# Patient Record
Sex: Male | Born: 1980 | Race: Black or African American | Hispanic: No | Marital: Single | Smoking: Current every day smoker
Health system: Southern US, Community
[De-identification: ages and names within clinical notes are randomized; demographics above are authoritative.]

## PROBLEM LIST (undated history)

## (undated) DIAGNOSIS — J45909 Unspecified asthma, uncomplicated: Secondary | ICD-10-CM

## (undated) HISTORY — PX: NO PAST SURGERIES: SHX2092

---

## 2008-07-26 ENCOUNTER — Emergency Department: Payer: Self-pay | Admitting: Emergency Medicine

## 2010-01-07 ENCOUNTER — Emergency Department (HOSPITAL_COMMUNITY): Admission: EM | Admit: 2010-01-07 | Discharge: 2010-01-07 | Payer: Self-pay | Admitting: Emergency Medicine

## 2010-08-23 ENCOUNTER — Emergency Department (HOSPITAL_COMMUNITY)
Admission: EM | Admit: 2010-08-23 | Discharge: 2010-08-23 | Payer: Self-pay | Source: Home / Self Care | Admitting: Emergency Medicine

## 2010-08-23 LAB — CARBOXYHEMOGLOBIN
Carboxyhemoglobin: 4.5 % — ABNORMAL HIGH (ref 0.5–1.5)
Methemoglobin: 1.6 % — ABNORMAL HIGH (ref 0.0–1.5)
O2 Saturation: 44.3 %
Total hemoglobin: 13.5 g/dL (ref 13.5–18.0)

## 2010-08-23 LAB — DIFFERENTIAL
Basophils Absolute: 0 10*3/uL (ref 0.0–0.1)
Basophils Relative: 0 % (ref 0–1)
Eosinophils Absolute: 0.1 10*3/uL (ref 0.0–0.7)
Eosinophils Relative: 1 % (ref 0–5)
Lymphocytes Relative: 12 % (ref 12–46)
Lymphs Abs: 1.3 10*3/uL (ref 0.7–4.0)
Monocytes Absolute: 0.8 10*3/uL (ref 0.1–1.0)
Monocytes Relative: 7 % (ref 3–12)
Neutro Abs: 8.7 10*3/uL — ABNORMAL HIGH (ref 1.7–7.7)
Neutrophils Relative %: 80 % — ABNORMAL HIGH (ref 43–77)

## 2010-08-23 LAB — BASIC METABOLIC PANEL
BUN: 13 mg/dL (ref 6–23)
CO2: 27 mEq/L (ref 19–32)
Calcium: 9.2 mg/dL (ref 8.4–10.5)
Chloride: 106 mEq/L (ref 96–112)
Creatinine, Ser: 1.26 mg/dL (ref 0.4–1.5)
GFR calc Af Amer: >60 mL/min (ref >60)
GFR calc non Af Amer: >60 mL/min (ref >60)
Glucose, Bld: 100 mg/dL — ABNORMAL HIGH (ref 70–99)
Potassium: 4 mEq/L (ref 3.5–5.1)
Sodium: 140 mEq/L (ref 135–145)

## 2010-08-23 LAB — CBC
HCT: 37.6 % — ABNORMAL LOW (ref 39.0–52.0)
Hemoglobin: 13.2 g/dL (ref 13.0–17.0)
MCH: 31.4 pg (ref 26.0–34.0)
MCHC: 35.1 g/dL (ref 30.0–36.0)
MCV: 89.5 fL (ref 78.0–100.0)
Platelets: 206 10*3/uL (ref 150–400)
RBC: 4.2 MIL/uL — ABNORMAL LOW (ref 4.22–5.81)
RDW: 12.8 % (ref 11.5–15.5)
WBC: 10.9 10*3/uL — ABNORMAL HIGH (ref 4.0–10.5)

## 2017-11-06 ENCOUNTER — Encounter (HOSPITAL_COMMUNITY): Payer: Self-pay | Admitting: Emergency Medicine

## 2017-11-06 ENCOUNTER — Emergency Department (HOSPITAL_COMMUNITY)
Admission: EM | Admit: 2017-11-06 | Discharge: 2017-11-07 | Disposition: A | Discharge status: Home / Self Care | Payer: Self-pay | Attending: Emergency Medicine | Admitting: Emergency Medicine

## 2017-11-06 DIAGNOSIS — R05 Cough: Secondary | ICD-10-CM | POA: Insufficient documentation

## 2017-11-06 DIAGNOSIS — J45909 Unspecified asthma, uncomplicated: Secondary | ICD-10-CM | POA: Insufficient documentation

## 2017-11-06 DIAGNOSIS — R0602 Shortness of breath: Secondary | ICD-10-CM | POA: Insufficient documentation

## 2017-11-06 DIAGNOSIS — J189 Pneumonia, unspecified organism: Secondary | ICD-10-CM | POA: Insufficient documentation

## 2017-11-06 DIAGNOSIS — R509 Fever, unspecified: Secondary | ICD-10-CM | POA: Insufficient documentation

## 2017-11-06 HISTORY — DX: Unspecified asthma, uncomplicated: J45.909

## 2017-11-06 MED ORDER — ALBUTEROL SULFATE HFA 108 (90 BASE) MCG/ACT IN AERS
2.0000 | INHALATION_SPRAY | RESPIRATORY_TRACT | Status: DC | PRN
  Administered 2017-11-06: 2 via RESPIRATORY_TRACT
  Filled 2017-11-06: qty 6.7

## 2017-11-06 NOTE — ED Provider Notes (Signed)
[facility] Provider Note   CSN: [id number] Arrival date & time: 11/06/17  2130     History   Chief Complaint Chief Complaint  Patient presents with  . Nasal Congestion  . Shortness of Breath  . Cough  . Fever    HPI Alexander Crawford is a 37 y.o. male.  HPI   37 year old male presenting with chest discomfort.  Patient report for the past 3 days he has had fever T-max 101, sinus congestion, achy chest pain body aches, shortness of breath, and chest tightness.  He went to donate plasma yesterday and states that his fever was 101.  Symptom seems worse at nighttime.  He recently exposed to a family member had pneumonia.  He endorses dry cough.  No complaints of headache, ear pain, sneezing, sore throat or rash.  He has tried Advil at home with some improvement.  History of juvenile asthma.  Denies any prior history of PE or DVT, no recent surgery, prolonged bed rest, active cancer or hemoptysis.  No significant cardiac history.  Past Medical History:  Diagnosis Date  . Asthma    childhood    There are no active problems to display for this patient.   History reviewed. No pertinent surgical history.      Home Medications    Prior to Admission medications   Not on File    Family History No family history on file.  Social History Social History   Tobacco Use  . Smoking status: Not on file  Substance Use Topics  . Alcohol use: Not on file  . Drug use: Not on file     Allergies   Patient has no known allergies.   Review of Systems Review of Systems  All other systems reviewed and are negative.    Physical Exam Updated Vital Signs BP 133/88 (BP Location: Right Arm)   Pulse 98   Temp 98.4 F (36.9 C) (Oral)   Resp 18   SpO2 99%   Physical Exam  Constitutional: He appears well-developed and well-nourished. No distress.  HENT:  Head: Atraumatic.  Mouth/Throat: Oropharynx is clear and moist. No oropharyngeal  exudate or posterior oropharyngeal edema.  Eyes: Conjunctivae are normal.  Neck: Normal range of motion. Neck supple.  Cardiovascular: Normal rate and regular rhythm.  Pulmonary/Chest: Effort normal. He has wheezes (Very faint wheezes heard.).  Abdominal: Soft. There is no tenderness.  Musculoskeletal: Normal range of motion.  Neurological: He is alert.  Skin: No rash noted.  Psychiatric: He has a normal mood and affect.  Nursing note and vitals reviewed.    ED Treatments / Results  Labs (all labs ordered are listed, but only abnormal results are displayed) Labs Reviewed - No data to display  EKG None  Radiology Dg Chest 2 View  Result Date: 11/07/2017 CLINICAL DATA:  Cough shortness of breath and fever EXAM: CHEST - 2 VIEW COMPARISON:  08/23/2010 FINDINGS: Lingular consolidation. No pleural effusion. Normal heart size. No pneumothorax. IMPRESSION: Lingular consolidation consistent with pneumonia. Electronically Signed   By: Jasmine PangKim  Fujinaga M.D.   On: 11/07/2017 00:14    Procedures Procedures (including critical care time)  Medications Ordered in ED Medications - No data to display   Initial Impression / Assessment and Plan / ED Course  I have reviewed the triage vital signs and the nursing notes.  Pertinent labs & imaging results that were available during my care of the patient were reviewed by me and considered in my medical  decision making (see chart for details).     BP 133/88 (BP Location: Right Arm)   Pulse 98   Temp 98.4 F (36.9 C) (Oral)   Resp 18   SpO2 99%    Final Clinical Impressions(s) / ED Diagnoses   Final diagnoses:  Lingular pneumonia    ED Discharge Orders        Ordered    azithromycin (ZITHROMAX Z-PAK) 250 MG tablet     11/07/17 0038    benzonatate (TESSALON) 100 MG capsule  Every 8 hours     11/07/17 0038    ibuprofen (ADVIL,MOTRIN) 600 MG tablet  Every 6 hours PRN     11/07/17 0038      11:52 PM Patient here with coughing, and  cold symptoms.  Recent exposure to family member with pneumonia therefore chest x-ray ordered.  He does has faint wheezes, albuterol given.  Symptom not suggestive of ACS or PE.  12:36 AM CXR showing L lingular pneumonia.  Will treat with z-pak and recommend repeat cxr in 1 week if no improvement.  Cough medication prescribed. No hypoxia.   Fayrene Helper, PA-C 11/07/17 1610    Dione Booze, MD 11/07/17 [phone]

## 2017-11-06 NOTE — ED Triage Notes (Signed)
Pt presents with flu like symptoms x 2-3 days; congestion and sob worse with lying; pain with coughing; fever yesterday when attempting to donate plasma

## 2017-11-07 ENCOUNTER — Emergency Department (HOSPITAL_COMMUNITY): Payer: Self-pay

## 2017-11-07 MED ORDER — AZITHROMYCIN 250 MG PO TABS
500.0000 mg | ORAL_TABLET | Freq: Once | ORAL | Status: AC
  Administered 2017-11-07: 500 mg via ORAL
  Filled 2017-11-07: qty 2

## 2017-11-07 MED ORDER — IBUPROFEN 600 MG PO TABS: 600.0000 mg | ORAL_TABLET | Freq: Four times a day (QID) | ORAL | 0 refills | Status: DC | PRN

## 2017-11-07 MED ORDER — AZITHROMYCIN 250 MG PO TABS: ORAL_TABLET | ORAL | 0 refills | Status: DC

## 2017-11-07 MED ORDER — BENZONATATE 100 MG PO CAPS: 100.0000 mg | ORAL_CAPSULE | Freq: Three times a day (TID) | ORAL | 0 refills | Status: DC

## 2017-11-07 NOTE — ED Notes (Signed)
Pt pulse ox. 98% on room air. No cough heard when nurse in room. Pt able to talk in full complete sentences without a pause. Denies chest pain. Pt has strong substance odor. States is also having nasal congestion.

## 2018-09-14 ENCOUNTER — Emergency Department (HOSPITAL_COMMUNITY)
Admission: EM | Admit: 2018-09-14 | Discharge: 2018-09-14 | Disposition: A | Discharge status: Home / Self Care | Payer: Self-pay | Attending: Emergency Medicine | Admitting: Emergency Medicine

## 2018-09-14 ENCOUNTER — Emergency Department (HOSPITAL_COMMUNITY): Payer: Self-pay

## 2018-09-14 DIAGNOSIS — S3994XA Unspecified injury of external genitals, initial encounter: Secondary | ICD-10-CM

## 2018-09-14 DIAGNOSIS — Y929 Unspecified place or not applicable: Secondary | ICD-10-CM | POA: Insufficient documentation

## 2018-09-14 DIAGNOSIS — Y9389 Activity, other specified: Secondary | ICD-10-CM | POA: Insufficient documentation

## 2018-09-14 DIAGNOSIS — W3400XA Accidental discharge from unspecified firearms or gun, initial encounter: Secondary | ICD-10-CM | POA: Insufficient documentation

## 2018-09-14 DIAGNOSIS — Y999 Unspecified external cause status: Secondary | ICD-10-CM | POA: Insufficient documentation

## 2018-09-14 DIAGNOSIS — S3120XA Unspecified open wound of penis, initial encounter: Secondary | ICD-10-CM | POA: Insufficient documentation

## 2018-09-14 DIAGNOSIS — J45909 Unspecified asthma, uncomplicated: Secondary | ICD-10-CM | POA: Insufficient documentation

## 2018-09-14 LAB — CBC
HCT: 47.8 % (ref 39.0–52.0)
Hemoglobin: 16.5 g/dL (ref 13.0–17.0)
MCH: 32.4 pg (ref 26.0–34.0)
MCHC: 34.5 g/dL (ref 30.0–36.0)
MCV: 93.9 fL (ref 80.0–100.0)
Platelets: 214 10*3/uL (ref 150–400)
RBC: 5.09 MIL/uL (ref 4.22–5.81)
RDW: 13.5 % (ref 11.5–15.5)
WBC: 12.8 10*3/uL — ABNORMAL HIGH (ref 4.0–10.5)
nRBC: 0 % (ref 0.0–0.2)

## 2018-09-14 LAB — BASIC METABOLIC PANEL
Anion gap: 11 (ref 5–15)
BUN: 6 mg/dL (ref 6–20)
CO2: 24 mmol/L (ref 22–32)
Calcium: 8.4 mg/dL — ABNORMAL LOW (ref 8.9–10.3)
Chloride: 102 mmol/L (ref 98–111)
Creatinine, Ser: 1.08 mg/dL (ref 0.61–1.24)
GFR calc Af Amer: >60 mL/min (ref >60)
GFR calc non Af Amer: >60 mL/min (ref >60)
Glucose, Bld: 114 mg/dL — ABNORMAL HIGH (ref 70–99)
Potassium: 4.3 mmol/L (ref 3.5–5.1)
Sodium: 137 mmol/L (ref 135–145)

## 2018-09-14 LAB — TYPE AND SCREEN
ABO/RH(D): O POS
Antibody Screen: NEGATIVE

## 2018-09-14 LAB — ABO/RH: ABO/RH(D): O POS

## 2018-09-14 MED ORDER — OXYCODONE-ACETAMINOPHEN 5-325 MG PO TABS: 1.0000 | ORAL_TABLET | Freq: Three times a day (TID) | ORAL | 0 refills | Status: DC | PRN

## 2018-09-14 MED ORDER — HYDROMORPHONE HCL 1 MG/ML IJ SOLN
0.5000 mg | Freq: Once | INTRAMUSCULAR | Status: AC
  Administered 2018-09-14: 0.5 mg via INTRAVENOUS
  Filled 2018-09-14: qty 1

## 2018-09-14 MED ORDER — OXYCODONE-ACETAMINOPHEN 5-325 MG PO TABS
1.0000 | ORAL_TABLET | Freq: Once | ORAL | Status: AC
  Administered 2018-09-14: 1 via ORAL
  Filled 2018-09-14: qty 1
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
## 2018-09-14 NOTE — ED Notes (Signed)
Patient verbalizes understanding of discharge instructions. Opportunity for questioning and answers were provided. Armband removed by staff, pt discharged from ED.  

## 2018-09-14 NOTE — ED Notes (Signed)
Urology at bedside.

## 2018-09-14 NOTE — ED Notes (Signed)
Patient transported to X-ray 
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
## 2019-12-18 DIAGNOSIS — W3400XA Accidental discharge from unspecified firearms or gun, initial encounter: Secondary | ICD-10-CM

## 2019-12-18 HISTORY — DX: Accidental discharge from unspecified firearms or gun, initial encounter: W34.00XA

## 2019-12-20 ENCOUNTER — Other Ambulatory Visit: Payer: Self-pay

## 2019-12-20 ENCOUNTER — Emergency Department (HOSPITAL_COMMUNITY): Payer: Self-pay

## 2019-12-20 ENCOUNTER — Inpatient Hospital Stay (HOSPITAL_COMMUNITY)
Admission: EM | Admit: 2019-12-20 | Discharge: 2019-12-23 | DRG: 492 | Disposition: A | Discharge status: Home / Self Care | Payer: Self-pay | Attending: Student | Admitting: Student

## 2019-12-20 ENCOUNTER — Encounter (HOSPITAL_COMMUNITY): Payer: Self-pay | Admitting: Emergency Medicine

## 2019-12-20 DIAGNOSIS — D62 Acute posthemorrhagic anemia: Secondary | ICD-10-CM | POA: Diagnosis not present

## 2019-12-20 DIAGNOSIS — S72412B Displaced unspecified condyle fracture of lower end of left femur, initial encounter for open fracture type I or II: Secondary | ICD-10-CM

## 2019-12-20 DIAGNOSIS — S82142A Displaced bicondylar fracture of left tibia, initial encounter for closed fracture: Secondary | ICD-10-CM | POA: Diagnosis present

## 2019-12-20 DIAGNOSIS — S72412A Displaced unspecified condyle fracture of lower end of left femur, initial encounter for closed fracture: Secondary | ICD-10-CM

## 2019-12-20 DIAGNOSIS — S72432B Displaced fracture of medial condyle of left femur, initial encounter for open fracture type I or II: Secondary | ICD-10-CM | POA: Diagnosis present

## 2019-12-20 DIAGNOSIS — F17200 Nicotine dependence, unspecified, uncomplicated: Secondary | ICD-10-CM | POA: Diagnosis present

## 2019-12-20 DIAGNOSIS — T148XXA Other injury of unspecified body region, initial encounter: Secondary | ICD-10-CM

## 2019-12-20 DIAGNOSIS — Z560 Unemployment, unspecified: Secondary | ICD-10-CM

## 2019-12-20 DIAGNOSIS — I10 Essential (primary) hypertension: Secondary | ICD-10-CM | POA: Diagnosis present

## 2019-12-20 DIAGNOSIS — W3400XA Accidental discharge from unspecified firearms or gun, initial encounter: Secondary | ICD-10-CM

## 2019-12-20 DIAGNOSIS — Z20822 Contact with and (suspected) exposure to covid-19: Secondary | ICD-10-CM | POA: Diagnosis present

## 2019-12-20 DIAGNOSIS — Z419 Encounter for procedure for purposes other than remedying health state, unspecified: Secondary | ICD-10-CM

## 2019-12-20 DIAGNOSIS — S82142B Displaced bicondylar fracture of left tibia, initial encounter for open fracture type I or II: Principal | ICD-10-CM | POA: Diagnosis present

## 2019-12-20 DIAGNOSIS — Z23 Encounter for immunization: Secondary | ICD-10-CM

## 2019-12-20 LAB — CBC WITH DIFFERENTIAL/PLATELET
Abs Immature Granulocytes: 0.06 10*3/uL (ref 0.00–0.07)
Basophils Absolute: 0.1 10*3/uL (ref 0.0–0.1)
Basophils Relative: 1 %
Eosinophils Absolute: 0 10*3/uL (ref 0.0–0.5)
Eosinophils Relative: 0 %
HCT: 36.7 % — ABNORMAL LOW (ref 39.0–52.0)
Hemoglobin: 12.9 g/dL — ABNORMAL LOW (ref 13.0–17.0)
Immature Granulocytes: 1 %
Lymphocytes Relative: 12 %
Lymphs Abs: 1.6 10*3/uL (ref 0.7–4.0)
MCH: 33.3 pg (ref 26.0–34.0)
MCHC: 35.1 g/dL (ref 30.0–36.0)
MCV: 94.8 fL (ref 80.0–100.0)
Monocytes Absolute: 1 10*3/uL (ref 0.1–1.0)
Monocytes Relative: 8 %
Neutro Abs: 10.5 10*3/uL — ABNORMAL HIGH (ref 1.7–7.7)
Neutrophils Relative %: 78 %
Platelets: 180 10*3/uL (ref 150–400)
RBC: 3.87 MIL/uL — ABNORMAL LOW (ref 4.22–5.81)
RDW: 13 % (ref 11.5–15.5)
WBC: 13.2 10*3/uL — ABNORMAL HIGH (ref 4.0–10.5)
nRBC: 0 % (ref 0.0–0.2)

## 2019-12-20 LAB — BASIC METABOLIC PANEL
Anion gap: 10 (ref 5–15)
BUN: 9 mg/dL (ref 6–20)
CO2: 21 mmol/L — ABNORMAL LOW (ref 22–32)
Calcium: 8.6 mg/dL — ABNORMAL LOW (ref 8.9–10.3)
Chloride: 106 mmol/L (ref 98–111)
Creatinine, Ser: 1.05 mg/dL (ref 0.61–1.24)
GFR calc Af Amer: >60 mL/min (ref >60)
GFR calc non Af Amer: >60 mL/min (ref >60)
Glucose, Bld: 100 mg/dL — ABNORMAL HIGH (ref 70–99)
Potassium: 4 mmol/L (ref 3.5–5.1)
Sodium: 137 mmol/L (ref 135–145)

## 2019-12-20 LAB — TYPE AND SCREEN
ABO/RH(D): O POS
Antibody Screen: NEGATIVE

## 2019-12-20 LAB — SURGICAL PCR SCREEN
MRSA, PCR: NEGATIVE
Staphylococcus aureus: NEGATIVE

## 2019-12-20 LAB — RESPIRATORY PANEL BY RT PCR (FLU A&B, COVID)
Influenza A by PCR: NEGATIVE
Influenza B by PCR: NEGATIVE
SARS Coronavirus 2 by RT PCR: NEGATIVE

## 2019-12-20 LAB — ABO/RH: ABO/RH(D): O POS

## 2019-12-20 LAB — GLUCOSE, CAPILLARY: Glucose-Capillary: 111 mg/dL — ABNORMAL HIGH (ref 70–99)

## 2019-12-20 MED ORDER — TETANUS-DIPHTH-ACELL PERTUSSIS 5-2.5-18.5 LF-MCG/0.5 IM SUSP
0.5000 mL | Freq: Once | INTRAMUSCULAR | Status: AC
  Administered 2019-12-20: 0.5 mL via INTRAMUSCULAR
  Filled 2019-12-20: qty 0.5

## 2019-12-20 MED ORDER — ONDANSETRON HCL 4 MG PO TABS
4.0000 mg | ORAL_TABLET | Freq: Four times a day (QID) | ORAL | Status: DC | PRN
  Administered 2019-12-20: 4 mg via ORAL
  Filled 2019-12-20: qty 1

## 2019-12-20 MED ORDER — ENSURE PRE-SURGERY PO LIQD
296.0000 mL | Freq: Once | ORAL | Status: AC
  Administered 2019-12-20: 296 mL via ORAL
  Filled 2019-12-20: qty 296

## 2019-12-20 MED ORDER — ONDANSETRON HCL 4 MG/2ML IJ SOLN
4.0000 mg | Freq: Four times a day (QID) | INTRAMUSCULAR | Status: DC | PRN
  Filled 2019-12-20: qty 2

## 2019-12-20 MED ORDER — METHOCARBAMOL 500 MG PO TABS
500.0000 mg | ORAL_TABLET | Freq: Four times a day (QID) | ORAL | Status: DC | PRN
  Administered 2019-12-20 – 2019-12-23 (×7): 500 mg via ORAL
  Filled 2019-12-20 (×8): qty 1

## 2019-12-20 MED ORDER — METHOCARBAMOL 1000 MG/10ML IJ SOLN
500.0000 mg | Freq: Four times a day (QID) | INTRAVENOUS | Status: DC | PRN
  Filled 2019-12-20: qty 5

## 2019-12-20 MED ORDER — FENTANYL CITRATE (PF) 100 MCG/2ML IJ SOLN
50.0000 ug | Freq: Once | INTRAMUSCULAR | Status: AC
  Administered 2019-12-20: 50 ug via INTRAVENOUS
  Filled 2019-12-20: qty 2

## 2019-12-20 MED ORDER — HYDROMORPHONE HCL 1 MG/ML IJ SOLN
0.5000 mg | Freq: Once | INTRAMUSCULAR | Status: AC
  Administered 2019-12-20: 13:00:00 0.5 mg via INTRAVENOUS
  Filled 2019-12-20: qty 1

## 2019-12-20 MED ORDER — CEFAZOLIN SODIUM-DEXTROSE 2-4 GM/100ML-% IV SOLN
2.0000 g | INTRAVENOUS | Status: AC
  Administered 2019-12-21: 2 g via INTRAVENOUS
  Filled 2019-12-20 (×2): qty 100

## 2019-12-20 MED ORDER — ENOXAPARIN SODIUM 40 MG/0.4ML ~~LOC~~ SOLN
40.0000 mg | SUBCUTANEOUS | Status: DC
  Administered 2019-12-20: 40 mg via SUBCUTANEOUS
  Filled 2019-12-20: qty 0.4

## 2019-12-20 MED ORDER — OXYCODONE HCL 5 MG PO TABS
5.0000 mg | ORAL_TABLET | ORAL | Status: DC | PRN
  Administered 2019-12-20 – 2019-12-22 (×6): 15 mg via ORAL
  Filled 2019-12-20 (×5): qty 3
  Filled 2019-12-20: qty 1
  Filled 2019-12-20: qty 3

## 2019-12-20 MED ORDER — POVIDONE-IODINE 10 % EX SWAB
2.0000 "application " | Freq: Once | CUTANEOUS | Status: AC
  Administered 2019-12-21: 2 via TOPICAL

## 2019-12-20 MED ORDER — FENTANYL CITRATE (PF) 100 MCG/2ML IJ SOLN
100.0000 ug | Freq: Once | INTRAMUSCULAR | Status: AC
  Administered 2019-12-20: 12:00:00 100 ug via INTRAVENOUS
  Filled 2019-12-20: qty 2

## 2019-12-20 MED ORDER — CEFAZOLIN SODIUM-DEXTROSE 1-4 GM/50ML-% IV SOLN
1.0000 g | Freq: Once | INTRAVENOUS | Status: AC
Start: 2019-12-20 — End: 2019-12-20
  Administered 2019-12-20: 10:00:00 1 g via INTRAVENOUS
  Filled 2019-12-20: qty 50

## 2019-12-20 MED ORDER — CHLORHEXIDINE GLUCONATE 4 % EX LIQD
60.0000 mL | Freq: Once | CUTANEOUS | Status: AC
  Administered 2019-12-21: 4 via TOPICAL
  Filled 2019-12-20: qty 60

## 2019-12-20 MED ORDER — MORPHINE SULFATE (PF) 2 MG/ML IV SOLN
2.0000 mg | INTRAVENOUS | Status: DC | PRN
  Administered 2019-12-20: 2 mg via INTRAVENOUS
  Filled 2019-12-20: qty 1

## 2019-12-20 MED ORDER — TETANUS-DIPHTH-ACELL PERTUSSIS 5-2.5-18.5 LF-MCG/0.5 IM SUSP
0.5000 mL | Freq: Once | INTRAMUSCULAR | Status: DC
  Filled 2019-12-20: qty 0.5

## 2019-12-20 MED ORDER — HYDROMORPHONE HCL 1 MG/ML IJ SOLN
1.0000 mg | INTRAMUSCULAR | Status: DC | PRN
  Administered 2019-12-20 – 2019-12-22 (×7): 1 mg via INTRAVENOUS
  Filled 2019-12-20 (×7): qty 1

## 2019-12-20 MED ORDER — HYDROMORPHONE HCL 1 MG/ML IJ SOLN
1.0000 mg | Freq: Once | INTRAMUSCULAR | Status: AC
  Administered 2019-12-20: 1 mg via INTRAVENOUS
  Filled 2019-12-20: qty 1

## 2019-12-20 MED ORDER — ZOLPIDEM TARTRATE 5 MG PO TABS
10.0000 mg | ORAL_TABLET | Freq: Every evening | ORAL | Status: DC | PRN
  Administered 2019-12-20: 10 mg via ORAL
  Filled 2019-12-20: qty 2
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
## 2019-12-20 NOTE — ED Notes (Signed)
[facility] called for transport to [facility] ED, Consulting civil engineer at [facility], Rubin Payor, MD is the accepting physician.
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
## 2019-12-20 NOTE — Plan of Care (Signed)
  Problem: Education: Goal: Knowledge of General Education information will improve Description: Including pain rating scale, medication(s)/side effects and non-pharmacologic comfort measures Outcome: Progressing   Problem: Pain Managment: Goal: General experience of comfort will improve Outcome: Progressing   Problem: Safety: Goal: Ability to remain free from injury will improve Outcome: Progressing   

## 2019-12-20 NOTE — ED Notes (Signed)
Patient transported to CT via stretcher.
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
## 2019-12-20 NOTE — ED Notes (Signed)
Ortho tech called and notified of knee immobilizer.

## 2019-12-20 NOTE — ED Notes (Signed)
Report given to CareLinkJerilynn Som
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
## 2019-12-20 NOTE — Progress Notes (Signed)
Orthopedic Tech Progress Note Patient Details:  Alexander Crawford 05/24/1981 [id number]  Ortho Devices Type of Ortho Device: Knee Immobilizer, Long leg splint Ortho Device/Splint Location: LLE Ortho Device/Splint Interventions: Ordered, Application, Adjustment   Post Interventions Patient Tolerated: Fair Instructions Provided: Care of device   Julieanna Geraci [name] 12/20/2019, 11:53 AM

## 2019-12-21 ENCOUNTER — Encounter (HOSPITAL_COMMUNITY)
Admission: EM | Disposition: A | Discharge status: Home / Self Care | Payer: Self-pay | Source: Home / Self Care | Attending: Student

## 2019-12-21 ENCOUNTER — Inpatient Hospital Stay (HOSPITAL_COMMUNITY): Payer: Self-pay | Admitting: Anesthesiology

## 2019-12-21 ENCOUNTER — Encounter (HOSPITAL_COMMUNITY): Payer: Self-pay | Admitting: Student

## 2019-12-21 ENCOUNTER — Inpatient Hospital Stay (HOSPITAL_COMMUNITY): Payer: Self-pay

## 2019-12-21 DIAGNOSIS — W3400XA Accidental discharge from unspecified firearms or gun, initial encounter: Secondary | ICD-10-CM

## 2019-12-21 DIAGNOSIS — S72412B Displaced unspecified condyle fracture of lower end of left femur, initial encounter for open fracture type I or II: Secondary | ICD-10-CM

## 2019-12-21 HISTORY — PX: ORIF TIBIA PLATEAU: SHX2132

## 2019-12-21 LAB — TYPE AND SCREEN
ABO/RH(D): O POS
Antibody Screen: NEGATIVE

## 2019-12-21 LAB — CBC
HCT: 36 % — ABNORMAL LOW (ref 39.0–52.0)
Hemoglobin: 12.4 g/dL — ABNORMAL LOW (ref 13.0–17.0)
MCH: 32.5 pg (ref 26.0–34.0)
MCHC: 34.4 g/dL (ref 30.0–36.0)
MCV: 94.2 fL (ref 80.0–100.0)
Platelets: 148 10*3/uL — ABNORMAL LOW (ref 150–400)
RBC: 3.82 MIL/uL — ABNORMAL LOW (ref 4.22–5.81)
RDW: 12.8 % (ref 11.5–15.5)
WBC: 12.9 10*3/uL — ABNORMAL HIGH (ref 4.0–10.5)
nRBC: 0 % (ref 0.0–0.2)

## 2019-12-21 LAB — CREATININE, SERUM
Creatinine, Ser: 1.06 mg/dL (ref 0.61–1.24)
GFR calc Af Amer: >60 mL/min (ref >60)
GFR calc non Af Amer: >60 mL/min (ref >60)

## 2019-12-21 LAB — HIV ANTIBODY (ROUTINE TESTING W REFLEX): HIV Screen 4th Generation wRfx: NONREACTIVE

## 2019-12-21 SURGERY — OPEN REDUCTION INTERNAL FIXATION (ORIF) TIBIAL PLATEAU
Anesthesia: General | Laterality: Left

## 2019-12-21 MED ORDER — FENTANYL CITRATE (PF) 250 MCG/5ML IJ SOLN
INTRAMUSCULAR | Status: AC
  Filled 2019-12-21: qty 5

## 2019-12-21 MED ORDER — MIDAZOLAM HCL 5 MG/5ML IJ SOLN
INTRAMUSCULAR | Status: DC | PRN
  Administered 2019-12-21: 2 mg via INTRAVENOUS

## 2019-12-21 MED ORDER — SUGAMMADEX SODIUM 200 MG/2ML IV SOLN
INTRAVENOUS | Status: DC | PRN
  Administered 2019-12-21: 170 mg via INTRAVENOUS

## 2019-12-21 MED ORDER — ROCURONIUM BROMIDE 10 MG/ML (PF) SYRINGE
PREFILLED_SYRINGE | INTRAVENOUS | Status: AC
  Filled 2019-12-21: qty 10

## 2019-12-21 MED ORDER — DEXAMETHASONE SODIUM PHOSPHATE 10 MG/ML IJ SOLN
INTRAMUSCULAR | Status: DC | PRN
Start: 2019-12-21 — End: 2019-12-21
  Administered 2019-12-21: 10 mg via INTRAVENOUS

## 2019-12-21 MED ORDER — LABETALOL HCL 5 MG/ML IV SOLN
10.0000 mg | INTRAVENOUS | Status: AC | PRN
  Administered 2019-12-21 (×2): 10 mg via INTRAVENOUS

## 2019-12-21 MED ORDER — FENTANYL CITRATE (PF) 100 MCG/2ML IJ SOLN
INTRAMUSCULAR | Status: AC
  Filled 2019-12-21: qty 2

## 2019-12-21 MED ORDER — LIDOCAINE 2% (20 MG/ML) 5 ML SYRINGE
INTRAMUSCULAR | Status: AC
  Filled 2019-12-21: qty 5

## 2019-12-21 MED ORDER — PROMETHAZINE HCL 25 MG/ML IJ SOLN: 6.2500 mg | INTRAMUSCULAR | Status: DC | PRN

## 2019-12-21 MED ORDER — PROPOFOL 10 MG/ML IV BOLUS
INTRAVENOUS | Status: DC | PRN
  Administered 2019-12-21: 200 mg via INTRAVENOUS

## 2019-12-21 MED ORDER — 0.9 % SODIUM CHLORIDE (POUR BTL) OPTIME
TOPICAL | Status: DC | PRN
  Administered 2019-12-21: 1000 mL

## 2019-12-21 MED ORDER — LIDOCAINE 2% (20 MG/ML) 5 ML SYRINGE
INTRAMUSCULAR | Status: DC | PRN
  Administered 2019-12-21: 100 mg via INTRAVENOUS

## 2019-12-21 MED ORDER — ONDANSETRON HCL 4 MG/2ML IJ SOLN
INTRAMUSCULAR | Status: AC
  Filled 2019-12-21: qty 2

## 2019-12-21 MED ORDER — MIDAZOLAM HCL 2 MG/2ML IJ SOLN
INTRAMUSCULAR | Status: AC
  Filled 2019-12-21: qty 2

## 2019-12-21 MED ORDER — LABETALOL HCL 5 MG/ML IV SOLN
INTRAVENOUS | Status: AC
  Filled 2019-12-21: qty 4

## 2019-12-21 MED ORDER — CEFAZOLIN SODIUM-DEXTROSE 2-4 GM/100ML-% IV SOLN
2.0000 g | Freq: Three times a day (TID) | INTRAVENOUS | Status: AC
  Administered 2019-12-21 – 2019-12-22 (×3): 2 g via INTRAVENOUS
  Filled 2019-12-21 (×3): qty 100

## 2019-12-21 MED ORDER — FENTANYL CITRATE (PF) 100 MCG/2ML IJ SOLN
25.0000 ug | INTRAMUSCULAR | Status: DC | PRN
  Administered 2019-12-21: 50 ug via INTRAVENOUS

## 2019-12-21 MED ORDER — METOCLOPRAMIDE HCL 5 MG PO TABS: 5.0000 mg | ORAL_TABLET | Freq: Three times a day (TID) | ORAL | Status: DC | PRN

## 2019-12-21 MED ORDER — VANCOMYCIN HCL 1000 MG IV SOLR
INTRAVENOUS | Status: DC | PRN
  Administered 2019-12-21: 1000 mg

## 2019-12-21 MED ORDER — METOCLOPRAMIDE HCL 5 MG/ML IJ SOLN: 5.0000 mg | Freq: Three times a day (TID) | INTRAMUSCULAR | Status: DC | PRN

## 2019-12-21 MED ORDER — ONDANSETRON HCL 4 MG/2ML IJ SOLN
INTRAMUSCULAR | Status: DC | PRN
  Administered 2019-12-21: 4 mg via INTRAVENOUS

## 2019-12-21 MED ORDER — FENTANYL CITRATE (PF) 100 MCG/2ML IJ SOLN
INTRAMUSCULAR | Status: DC | PRN
  Administered 2019-12-21: 150 ug via INTRAVENOUS
  Administered 2019-12-21: 50 ug via INTRAVENOUS
  Administered 2019-12-21: 100 ug via INTRAVENOUS

## 2019-12-21 MED ORDER — LACTATED RINGERS IV SOLN: INTRAVENOUS | Status: DC

## 2019-12-21 MED ORDER — DEXAMETHASONE SODIUM PHOSPHATE 10 MG/ML IJ SOLN
INTRAMUSCULAR | Status: AC
  Filled 2019-12-21: qty 1

## 2019-12-21 MED ORDER — ROCURONIUM BROMIDE 10 MG/ML (PF) SYRINGE
PREFILLED_SYRINGE | INTRAVENOUS | Status: DC | PRN
  Administered 2019-12-21: 60 mg via INTRAVENOUS
  Administered 2019-12-21: 40 mg via INTRAVENOUS

## 2019-12-21 MED ORDER — POLYETHYLENE GLYCOL 3350 17 G PO PACK: 17.0000 g | PACK | Freq: Every day | ORAL | Status: DC | PRN

## 2019-12-21 MED ORDER — DOCUSATE SODIUM 100 MG PO CAPS
100.0000 mg | ORAL_CAPSULE | Freq: Two times a day (BID) | ORAL | Status: DC
  Administered 2019-12-21 – 2019-12-23 (×4): 100 mg via ORAL
  Filled 2019-12-21 (×4): qty 1

## 2019-12-21 MED ORDER — ENOXAPARIN SODIUM 40 MG/0.4ML ~~LOC~~ SOLN
40.0000 mg | SUBCUTANEOUS | Status: DC
  Administered 2019-12-22 – 2019-12-23 (×2): 40 mg via SUBCUTANEOUS
  Filled 2019-12-21 (×2): qty 0.4

## 2019-12-21 MED ORDER — POTASSIUM CHLORIDE IN NACL 20-0.9 MEQ/L-% IV SOLN
INTRAVENOUS | Status: DC
  Filled 2019-12-21: qty 1000

## 2019-12-21 SURGICAL SUPPLY — 77 items
BANDAGE ESMARK 6X9 LF (GAUZE/BANDAGES/DRESSINGS) ×1 IMPLANT
BIT DRILL QC 2.0 SHORT EVOS SM (DRILL) IMPLANT
BLADE CLIPPER SURG (BLADE) ×2 IMPLANT
BLADE SURG 15 STRL LF DISP TIS (BLADE) ×1 IMPLANT
BLADE SURG 15 STRL SS (BLADE) ×2
BNDG ELASTIC 4X5.8 VLCR STR LF (GAUZE/BANDAGES/DRESSINGS) ×3 IMPLANT
BNDG ELASTIC 6X5.8 VLCR STR LF (GAUZE/BANDAGES/DRESSINGS) ×3 IMPLANT
BNDG ESMARK 6X9 LF (GAUZE/BANDAGES/DRESSINGS) ×3
BNDG GAUZE ELAST 4 BULKY (GAUZE/BANDAGES/DRESSINGS) ×3 IMPLANT
BRUSH SCRUB EZ PLAIN DRY (MISCELLANEOUS) ×6 IMPLANT
CANISTER SUCT 3000ML PPV (MISCELLANEOUS) ×1 IMPLANT
CHLORAPREP W/TINT 26 (MISCELLANEOUS) ×6 IMPLANT
COVER SURGICAL LIGHT HANDLE (MISCELLANEOUS) ×3 IMPLANT
COVER WAND RF STERILE (DRAPES) ×3 IMPLANT
CUFF TOURN SGL QUICK 34 (TOURNIQUET CUFF) ×2
CUFF TRNQT CYL 34X4.125X (TOURNIQUET CUFF) ×1 IMPLANT
DRAPE C-ARM 42X72 X-RAY (DRAPES) ×3 IMPLANT
DRAPE C-ARMOR (DRAPES) ×3 IMPLANT
DRAPE ORTHO SPLIT 77X108 STRL (DRAPES) ×4
DRAPE SURG ORHT 6 SPLT 77X108 (DRAPES) ×2 IMPLANT
DRAPE U-SHAPE 47X51 STRL (DRAPES) ×3 IMPLANT
DRILL QC 2.0 SHORT EVOS SM (DRILL) ×3
DRSG MEPILEX BORDER 4X4 (GAUZE/BANDAGES/DRESSINGS) ×2 IMPLANT
DRSG PAD ABDOMINAL 8X10 ST (GAUZE/BANDAGES/DRESSINGS) ×6 IMPLANT
ELECT REM PT RETURN 9FT ADLT (ELECTROSURGICAL) ×3
ELECTRODE REM PT RTRN 9FT ADLT (ELECTROSURGICAL) ×1 IMPLANT
GAUZE SPONGE 4X4 12PLY STRL (GAUZE/BANDAGES/DRESSINGS) ×3 IMPLANT
GLOVE BIO SURGEON STRL SZ 6.5 (GLOVE) ×6 IMPLANT
GLOVE BIO SURGEON STRL SZ7.5 (GLOVE) ×12 IMPLANT
GLOVE BIO SURGEONS STRL SZ 6.5 (GLOVE) ×3
GLOVE BIOGEL PI IND STRL 6.5 (GLOVE) ×1 IMPLANT
GLOVE BIOGEL PI IND STRL 7.5 (GLOVE) ×1 IMPLANT
GLOVE BIOGEL PI INDICATOR 6.5 (GLOVE) ×2
GLOVE BIOGEL PI INDICATOR 7.5 (GLOVE) ×2
GOWN STRL REUS W/ TWL LRG LVL3 (GOWN DISPOSABLE) ×2 IMPLANT
GOWN STRL REUS W/TWL LRG LVL3 (GOWN DISPOSABLE) ×4
IMMOBILIZER KNEE 22 UNIV (SOFTGOODS) ×3 IMPLANT
K-WIRE 1.6 (WIRE) ×4
K-WIRE FX150X1.6XTROC PNT (WIRE) ×2
KIT BASIN OR (CUSTOM PROCEDURE TRAY) ×3 IMPLANT
KIT TURNOVER KIT B (KITS) ×3 IMPLANT
KWIRE FX150X1.6XTROC PNT (WIRE) IMPLANT
NDL SUT 6 .5 CRC .975X.05 MAYO (NEEDLE) ×1 IMPLANT
NEEDLE MAYO TAPER (NEEDLE)
NS IRRIG 1000ML POUR BTL (IV SOLUTION) ×3 IMPLANT
PACK TOTAL JOINT (CUSTOM PROCEDURE TRAY) ×3 IMPLANT
PAD ARMBOARD 7.5X6 YLW CONV (MISCELLANEOUS) ×6 IMPLANT
PAD CAST 4YDX4 CTTN HI CHSV (CAST SUPPLIES) ×1 IMPLANT
PADDING CAST COTTON 4X4 STRL (CAST SUPPLIES) ×2
PADDING CAST COTTON 6X4 STRL (CAST SUPPLIES) ×3 IMPLANT
PLATE BONE 2.7 HOLE X3 T SH (Plate) ×2 IMPLANT
SCREW CORT 2.7X38 T8 ST EVOS (Screw) ×2 IMPLANT
SCREW CORT VA EVOS 2.7X32 (Screw) ×4 IMPLANT
SCREW EVOS 2.7 X 50 LCK T8 S-T (Screw) ×2 IMPLANT
SCREW EVOS 2.7 X 55 LCK T8 S-T (Screw) ×4 IMPLANT
SCREW EVOS 2.7 X 60 LCK T8 S-T (Screw) ×2 IMPLANT
STAPLER VISISTAT 35W (STAPLE) ×3 IMPLANT
SUCTION FRAZIER HANDLE 10FR (MISCELLANEOUS) ×2
SUCTION TUBE FRAZIER 10FR DISP (MISCELLANEOUS) ×1 IMPLANT
SUT ETHILON 2 0 FS 18 (SUTURE) ×3 IMPLANT
SUT ETHILON 3 0 PS 1 (SUTURE) IMPLANT
SUT FIBERWIRE #2 38 T-5 BLUE (SUTURE)
SUT MNCRL+ AB 3-0 CT1 36 (SUTURE) IMPLANT
SUT MON AB 2-0 CT1 36 (SUTURE) ×2 IMPLANT
SUT MONOCRYL AB 3-0 CT1 36IN (SUTURE) ×2
SUT VIC AB 0 CT1 27 (SUTURE) ×4
SUT VIC AB 0 CT1 27XBRD ANBCTR (SUTURE) IMPLANT
SUT VIC AB 1 CT1 18XCR BRD 8 (SUTURE) IMPLANT
SUT VIC AB 1 CT1 27 (SUTURE)
SUT VIC AB 1 CT1 27XBRD ANBCTR (SUTURE) ×1 IMPLANT
SUT VIC AB 1 CT1 8-18 (SUTURE)
SUT VIC AB 2-0 CT1 27 (SUTURE) ×4
SUT VIC AB 2-0 CT1 TAPERPNT 27 (SUTURE) ×2 IMPLANT
SUTURE FIBERWR #2 38 T-5 BLUE (SUTURE) IMPLANT
TOWEL GREEN STERILE (TOWEL DISPOSABLE) ×6 IMPLANT
TRAY FOLEY MTR SLVR 16FR STAT (SET/KITS/TRAYS/PACK) IMPLANT
WATER STERILE IRR 1000ML POUR (IV SOLUTION) ×6 IMPLANT

## 2019-12-21 NOTE — Progress Notes (Signed)
Pt back to unit in no apparent distress,accompanied by PACU staff. Acute/oriented, denies any pain /discomfort at this time. No complaints.
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
## 2019-12-21 NOTE — Plan of Care (Signed)

## 2019-12-21 NOTE — Progress Notes (Signed)
Orthopedic Tech Progress Note Patient Details:  Alexander Crawford 1981/02/28 [id number] Brace has been ordered  Patient ID: [name], male   DOB: 1981/01/25, 39 y.o.   MRN: [id number]   [name] 12/21/2019, 6:16 PM

## 2019-12-21 NOTE — Anesthesia Procedure Notes (Signed)
Procedure Name: Intubation Date/Time: 12/21/2019 1:25 PM Performed by: Kyung Rudd, CRNA Pre-anesthesia Checklist: Patient identified, Emergency Drugs available, Suction available and Patient being monitored Patient Re-evaluated:Patient Re-evaluated prior to induction Oxygen Delivery Method: Circle system utilized Preoxygenation: Pre-oxygenation with 100% oxygen Induction Type: IV induction Ventilation: Mask ventilation without difficulty Laryngoscope Size: Mac and 4 Grade View: Grade I Tube type: Oral Tube size: 7.5 mm Number of attempts: 1 Airway Equipment and Method: Stylet Placement Confirmation: ETT inserted through vocal cords under direct vision,  positive ETCO2 and breath sounds checked- equal and bilateral Secured at: 22 cm Tube secured with: Tape Dental Injury: Teeth and Oropharynx as per pre-operative assessment
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
## 2019-12-21 NOTE — Transfer of Care (Signed)
Immediate Anesthesia Transfer of Care Note  Patient: Alexander Crawford  Procedure(s) Performed: OPEN REDUCTION INTERNAL FIXATION (ORIF) TIBIAL PLATEAU (Left )  Patient Location: PACU  Anesthesia Type:General  Level of Consciousness: awake  Airway & Oxygen Therapy: Patient Spontanous Breathing  Post-op Assessment: Report given to RN and Patient moving all extremities X 4  Post vital signs: Reviewed and stable  Last Vitals:  Vitals Value Taken Time  BP 165/112 12/21/19 1534  Temp    Pulse 104 12/21/19 1535  Resp 19 12/21/19 1535  SpO2 96 % 12/21/19 1535  Vitals shown include unvalidated device data.  Last Pain:  Vitals:   12/21/19 0813  TempSrc: Oral  PainSc:       Patients Stated Pain Goal: 2 (12/20/19 2013)  Complications: No apparent anesthesia complications

## 2019-12-21 NOTE — Anesthesia Postprocedure Evaluation (Signed)
Anesthesia Post Note  Patient: Alexander Crawford  Procedure(s) Performed: OPEN REDUCTION INTERNAL FIXATION (ORIF) TIBIAL PLATEAU (Left )     Patient location during evaluation: PACU Anesthesia Type: General Level of consciousness: awake and alert Pain management: pain level controlled Vital Signs Assessment: post-procedure vital signs reviewed and stable Respiratory status: spontaneous breathing, nonlabored ventilation, respiratory function stable and patient connected to nasal cannula oxygen Cardiovascular status: blood pressure returned to baseline and stable Postop Assessment: no apparent nausea or vomiting Anesthetic complications: no    Last Vitals:  Vitals:   12/21/19 1534 12/21/19 1550  BP: (!) 165/112 (!) 173/111  Pulse: 99 99  Resp: 17 18  Temp: (!) 36.3 C   SpO2: 96% 97%    Last Pain:  Vitals:   12/21/19 1550  TempSrc:   PainSc: 0-No pain                 Cecile Hearing

## 2019-12-21 NOTE — Progress Notes (Signed)
This RN has taken over for previous RN, sherrie. I agree with the daily assessment charted and will continue to monitor.

## 2019-12-22 ENCOUNTER — Encounter: Payer: Self-pay | Admitting: *Deleted

## 2019-12-22 LAB — CBC
HCT: 34.3 % — ABNORMAL LOW (ref 39.0–52.0)
Hemoglobin: 11.9 g/dL — ABNORMAL LOW (ref 13.0–17.0)
MCH: 32.3 pg (ref 26.0–34.0)
MCHC: 34.7 g/dL (ref 30.0–36.0)
MCV: 93.2 fL (ref 80.0–100.0)
Platelets: 149 10*3/uL — ABNORMAL LOW (ref 150–400)
RBC: 3.68 MIL/uL — ABNORMAL LOW (ref 4.22–5.81)
RDW: 12.6 % (ref 11.5–15.5)
WBC: 12.6 10*3/uL — ABNORMAL HIGH (ref 4.0–10.5)
nRBC: 0 % (ref 0.0–0.2)

## 2019-12-22 LAB — BASIC METABOLIC PANEL
Anion gap: 8 (ref 5–15)
BUN: 7 mg/dL (ref 6–20)
CO2: 25 mmol/L (ref 22–32)
Calcium: 8.5 mg/dL — ABNORMAL LOW (ref 8.9–10.3)
Chloride: 100 mmol/L (ref 98–111)
Creatinine, Ser: 0.91 mg/dL (ref 0.61–1.24)
GFR calc Af Amer: >60 mL/min (ref >60)
GFR calc non Af Amer: >60 mL/min (ref >60)
Glucose, Bld: 148 mg/dL — ABNORMAL HIGH (ref 70–99)
Potassium: 4.4 mmol/L (ref 3.5–5.1)
Sodium: 133 mmol/L — ABNORMAL LOW (ref 135–145)

## 2019-12-22 LAB — VITAMIN D 25 HYDROXY (VIT D DEFICIENCY, FRACTURES): Vit D, 25-Hydroxy: 7.01 ng/mL — ABNORMAL LOW (ref 30–100)

## 2019-12-22 MED ORDER — OXYCODONE HCL 5 MG PO TABS: 5.0000 mg | ORAL_TABLET | ORAL | Status: DC | PRN

## 2019-12-22 MED ORDER — ACETAMINOPHEN 500 MG PO TABS
1000.0000 mg | ORAL_TABLET | Freq: Four times a day (QID) | ORAL | Status: DC
  Administered 2019-12-22 – 2019-12-23 (×4): 1000 mg via ORAL
  Filled 2019-12-22 (×4): qty 2

## 2019-12-22 MED ORDER — HYDROMORPHONE HCL 1 MG/ML IJ SOLN
1.0000 mg | INTRAMUSCULAR | Status: DC | PRN
  Administered 2019-12-23 (×2): 1 mg via INTRAVENOUS
  Filled 2019-12-22 (×2): qty 1

## 2019-12-22 MED ORDER — LABETALOL HCL 5 MG/ML IV SOLN: 10.0000 mg | INTRAVENOUS | Status: DC | PRN

## 2019-12-22 MED ORDER — OXYCODONE HCL 5 MG PO TABS
10.0000 mg | ORAL_TABLET | ORAL | Status: DC | PRN
  Administered 2019-12-22 – 2019-12-23 (×5): 15 mg via ORAL
  Filled 2019-12-22 (×5): qty 3

## 2019-12-22 MED ORDER — VITAMIN D 25 MCG (1000 UNIT) PO TABS
2000.0000 [IU] | ORAL_TABLET | Freq: Every day | ORAL | Status: DC
  Administered 2019-12-23: 09:00:00 2000 [IU] via ORAL
  Filled 2019-12-22 (×2): qty 2

## 2019-12-22 MED ORDER — KETOROLAC TROMETHAMINE 15 MG/ML IJ SOLN
15.0000 mg | Freq: Four times a day (QID) | INTRAMUSCULAR | Status: DC
  Administered 2019-12-22 – 2019-12-23 (×4): 15 mg via INTRAVENOUS
  Filled 2019-12-22 (×4): qty 1

## 2019-12-22 MED ORDER — VITAMIN D (ERGOCALCIFEROL) 1.25 MG (50000 UNIT) PO CAPS
50000.0000 [IU] | ORAL_CAPSULE | ORAL | Status: DC
  Administered 2019-12-22: 18:00:00 50000 [IU] via ORAL
  Filled 2019-12-22: qty 1

## 2019-12-22 NOTE — Plan of Care (Signed)
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
## 2019-12-22 NOTE — Care Management (Signed)
Ordered walker and 3 in 1 with [facility] with [employer].  Ronny Flurry RN

## 2019-12-23 LAB — BASIC METABOLIC PANEL
Anion gap: 7 (ref 5–15)
BUN: 8 mg/dL (ref 6–20)
CO2: 27 mmol/L (ref 22–32)
Calcium: 8.5 mg/dL — ABNORMAL LOW (ref 8.9–10.3)
Chloride: 101 mmol/L (ref 98–111)
Creatinine, Ser: 0.94 mg/dL (ref 0.61–1.24)
GFR calc Af Amer: >60 mL/min (ref >60)
GFR calc non Af Amer: >60 mL/min (ref >60)
Glucose, Bld: 142 mg/dL — ABNORMAL HIGH (ref 70–99)
Potassium: 3.8 mmol/L (ref 3.5–5.1)
Sodium: 135 mmol/L (ref 135–145)

## 2019-12-23 LAB — CBC
HCT: 32 % — ABNORMAL LOW (ref 39.0–52.0)
Hemoglobin: 11 g/dL — ABNORMAL LOW (ref 13.0–17.0)
MCH: 32.4 pg (ref 26.0–34.0)
MCHC: 34.4 g/dL (ref 30.0–36.0)
MCV: 94.4 fL (ref 80.0–100.0)
Platelets: 155 10*3/uL (ref 150–400)
RBC: 3.39 MIL/uL — ABNORMAL LOW (ref 4.22–5.81)
RDW: 12.6 % (ref 11.5–15.5)
WBC: 7.6 10*3/uL (ref 4.0–10.5)
nRBC: 0 % (ref 0.0–0.2)

## 2019-12-23 MED ORDER — VITAMIN D 50 MCG (2000 UT) PO CAPS: 2000.0000 [IU] | ORAL_CAPSULE | Freq: Every day | ORAL | 1 refills | Status: DC

## 2019-12-23 MED ORDER — ASPIRIN EC 325 MG PO TBEC
325.0000 mg | DELAYED_RELEASE_TABLET | Freq: Two times a day (BID) | ORAL | 0 refills | Status: DC
Start: 2019-12-23 — End: 2019-12-23

## 2019-12-23 MED ORDER — METHOCARBAMOL 500 MG PO TABS: 500.0000 mg | ORAL_TABLET | Freq: Four times a day (QID) | ORAL | 0 refills | Status: DC | PRN

## 2019-12-23 MED ORDER — OXYCODONE-ACETAMINOPHEN 7.5-325 MG PO TABS: 1.0000 | ORAL_TABLET | ORAL | 0 refills | Status: DC | PRN

## 2019-12-23 MED ORDER — VITAMIN D (ERGOCALCIFEROL) 1.25 MG (50000 UNIT) PO CAPS: 50000.0000 [IU] | ORAL_CAPSULE | ORAL | 0 refills | Status: DC

## 2019-12-23 MED ORDER — ASPIRIN EC 325 MG PO TBEC
325.0000 mg | DELAYED_RELEASE_TABLET | Freq: Two times a day (BID) | ORAL | 0 refills | Status: AC
Start: 2019-12-23 — End: 2020-01-22

## 2019-12-23 NOTE — Discharge Instructions (Signed)
[facility] Discharge Instructions   General Discharge Instructions  WEIGHT BEARING STATUS: Non-weightbearing on left leg  RANGE OF MOTION/ACTIVITY: Keep knee in full extension in the hinged knee brace. Okay to remove brace for showering  Wound Care:Okay to remove surgical dressing on Sunday 12/25/2019. Incisions can be left open to air if there is no drainage. If incision continues to have drainage, follow wound care instructions below. Okay to shower if no drainage from incisions.  DVT/PE prophylaxis: Aspirin 325 mg twice daily  Diet: as you were eating previously.  Can use over the counter stool softeners and bowel preparations, such as Miralax, to help with bowel movements.  Narcotics can be constipating.  Be sure to drink plenty of fluids  PAIN MEDICATION USE AND EXPECTATIONS  You have likely been given narcotic medications to help control your pain.  After a traumatic event that results in an fracture (broken bone) with or without surgery, it is ok to use narcotic pain medications to help control one's pain.  We understand that everyone responds to pain differently and each individual patient will be evaluated on a regular basis for the continued need for narcotic medications. Ideally, narcotic medication use should last no more than 6-8 weeks (coinciding with fracture healing).   As a patient it is your responsibility as well to monitor narcotic medication use and report the amount and frequency you use these medications when you come to your office visit.   We would also advise that if you are using narcotic medications, you should take a dose prior to therapy to maximize you participation.  IF YOU ARE ON NARCOTIC MEDICATIONS IT IS NOT PERMISSIBLE TO OPERATE A MOTOR VEHICLE (MOTORCYCLE/CAR/TRUCK/MOPED) OR HEAVY MACHINERY DO NOT MIX NARCOTICS WITH OTHER CNS (CENTRAL NERVOUS SYSTEM) DEPRESSANTS SUCH AS ALCOHOL   STOP SMOKING OR USING NICOTINE PRODUCTS!!!!  As discussed  nicotine severely impairs your body's ability to heal surgical and traumatic wounds but also impairs bone healing.  Wounds and bone heal by forming microscopic blood vessels (angiogenesis) and nicotine is a vasoconstrictor (essentially, shrinks blood vessels).  Therefore, if vasoconstriction occurs to these microscopic blood vessels they essentially disappear and are unable to deliver necessary nutrients to the healing tissue.  This is one modifiable factor that you can do to dramatically increase your chances of healing your injury.    (This means no smoking, no nicotine gum, patches, etc)  DO NOT USE NONSTEROIDAL ANTI-INFLAMMATORY DRUGS (NSAID'S)  Using products such as Advil (ibuprofen), Aleve (naproxen), Motrin (ibuprofen) for additional pain control during fracture healing can delay and/or prevent the healing response.  If you would like to take over the counter (OTC) medication, Tylenol (acetaminophen) is ok.  However, some narcotic medications that are given for pain control contain acetaminophen as well. Therefore, you should not exceed more than 4000 mg of tylenol in a day if you do not have liver disease.  Also note that there are may OTC medicines, such as cold medicines and allergy medicines that my contain tylenol as well.  If you have any questions about medications and/or interactions please ask your doctor/PA or your pharmacist.      ICE AND ELEVATE INJURED/OPERATIVE EXTREMITY  Using ice and elevating the injured extremity above your heart can help with swelling and pain control.  Icing in a pulsatile fashion, such as 20 minutes on and 20 minutes off, can be followed.    Do not place ice directly on skin. Make sure there is a barrier between  to skin and the ice pack.    Using frozen items such as frozen peas works well as the conform nicely to the are that needs to be iced.  USE AN ACE WRAP OR TED HOSE FOR SWELLING CONTROL  In addition to icing and elevation, Ace wraps or TED hose are  used to help limit and resolve swelling.  It is recommended to use Ace wraps or TED hose until you are informed to stop.    When using Ace Wraps start the wrapping distally (farthest away from the body) and wrap proximally (closer to the body)   Example: If you had surgery on your leg or thing and you do not have a splint on, start the ace wrap at the toes and work your way up to the thigh        If you had surgery on your upper extremity and do not have a splint on, start the ace wrap at your fingers and work your way up to the upper arm  IF YOU ARE IN A SPLINT OR CAST DO NOT REMOVE IT FOR ANY REASON   If your splint gets wet for any reason please contact the office immediately. You may shower in your splint or cast as long as you keep it dry.  This can be done by wrapping in a cast cover or garbage back (or similar)  Do Not stick any thing down your splint or cast such as pencils, money, or hangers to try and scratch yourself with.  If you feel itchy take benadryl as prescribed on the bottle for itching  IF YOU ARE IN A CAM BOOT (BLACK BOOT)  You may remove boot periodically. Perform daily dressing changes as noted below.  Wash the liner of the boot regularly and wear a sock when wearing the boot. It is recommended that you sleep in the boot until told otherwise   CALL THE OFFICE WITH ANY QUESTIONS OR CONCERNS: [phone]   VISIT OUR WEBSITE FOR ADDITIONAL INFORMATION: [email or web address]      Discharge Pin Site Instructions  Dress pins daily with Kerlix roll starting on POD 2. Wrap the Kerlix so that it tamps the skin down around the pin-skin interface to prevent/limit motion of the skin relative to the pin.  (Pin-skin motion is the primary cause of pain and infection related to external fixator pin sites).  Remove any crust or coagulum that may obstruct drainage with a saline moistened gauze or soap and water.  After POD 3, if there is no discernable drainage on the pin site dressing,  the interval for change can by increased to every other day.  You may shower with the fixator, cleaning all pin sites gently with soap and water.  If you have a surgical wound this needs to be completely dry and without drainage before showering.  The extremity can be lifted by the fixator to facilitate wound care and transfers.  Notify the office/Doctor if you experience increasing drainage, redness, or pain from a pin site, or if you notice purulent (thick, snot-like) drainage.  Discharge Wound Care Instructions  Do NOT apply any ointments, solutions or lotions to pin sites or surgical wounds.  These prevent needed drainage and even though solutions like hydrogen peroxide kill bacteria, they also damage cells lining the pin sites that help fight infection.  Applying lotions or ointments can keep the wounds moist and can cause them to breakdown and open up as well. This can increase the risk for infection. When  in doubt call the office.  Surgical incisions should be dressed daily.  If any drainage is noted, use one layer of adaptic, then gauze, Kerlix, and an ace wrap.  Once the incision is completely dry and without drainage, it may be left open to air out.  Showering may begin 36-48 hours later.  Cleaning gently with soap and water.  Traumatic wounds should be dressed daily as well.    One layer of adaptic, gauze, Kerlix, then ace wrap.  The adaptic can be discontinued once the draining has ceased    If you have a wet to dry dressing: wet the gauze with saline the squeeze as much saline out so the gauze is moist (not soaking wet), place moistened gauze over wound, then place a dry gauze over the moist one, followed by Kerlix wrap, then ace wrap.

## 2019-12-23 NOTE — Plan of Care (Signed)
  Problem: Education: Goal: Knowledge of General Education information will improve Description: Including pain rating scale, medication(s)/side effects and non-pharmacologic comfort measures Outcome: Adequate for Discharge   Problem: Health Behavior/Discharge Planning: Goal: Ability to manage health-related needs will improve Outcome: Adequate for Discharge   Problem: Activity: Goal: Risk for activity intolerance will decrease Outcome: Adequate for Discharge   Problem: Pain Managment: Goal: General experience of comfort will improve Outcome: Adequate for Discharge   Problem: Safety: Goal: Ability to remain free from injury will improve Outcome: Adequate for Discharge
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
## 2019-12-23 NOTE — Progress Notes (Signed)
Pt and mother had asked for medications to be sent to [facility] pharmacy, RN explained this may take 1-2 hours to get them ready and have CM assist with payment. They both understood but then later pt was attempting to walk in hall because he could no longer wait due to his ride not being able to wait. He asked for medications to be sent back to [facility] pharmacy and he will pick them up later. PA informed of this. Discharge instructions gone over with pt and mother present. He verbalized understanding. All questions answered. DME at bedside. All belongings gathered to be sent home. Pt in no distress at discharge.

## 2020-10-10 ENCOUNTER — Encounter (HOSPITAL_COMMUNITY): Payer: Self-pay

## 2020-10-10 ENCOUNTER — Emergency Department (HOSPITAL_COMMUNITY): Payer: Self-pay

## 2020-10-10 ENCOUNTER — Emergency Department (HOSPITAL_COMMUNITY)
Admission: EM | Admit: 2020-10-10 | Discharge: 2020-10-10 | Disposition: A | Discharge status: Home / Self Care | Payer: Self-pay | Attending: Emergency Medicine | Admitting: Emergency Medicine

## 2020-10-10 ENCOUNTER — Other Ambulatory Visit: Payer: Self-pay

## 2020-10-10 DIAGNOSIS — R1013 Epigastric pain: Secondary | ICD-10-CM

## 2020-10-10 DIAGNOSIS — M25551 Pain in right hip: Secondary | ICD-10-CM

## 2020-10-10 DIAGNOSIS — M25521 Pain in right elbow: Secondary | ICD-10-CM | POA: Insufficient documentation

## 2020-10-10 DIAGNOSIS — M791 Myalgia, unspecified site: Secondary | ICD-10-CM | POA: Insufficient documentation

## 2020-10-10 DIAGNOSIS — J45909 Unspecified asthma, uncomplicated: Secondary | ICD-10-CM | POA: Insufficient documentation

## 2020-10-10 DIAGNOSIS — K2921 Alcoholic gastritis with bleeding: Secondary | ICD-10-CM

## 2020-10-10 DIAGNOSIS — R42 Dizziness and giddiness: Secondary | ICD-10-CM | POA: Insufficient documentation

## 2020-10-10 DIAGNOSIS — R519 Headache, unspecified: Secondary | ICD-10-CM | POA: Insufficient documentation

## 2020-10-10 DIAGNOSIS — R55 Syncope and collapse: Secondary | ICD-10-CM | POA: Insufficient documentation

## 2020-10-10 DIAGNOSIS — Z79899 Other long term (current) drug therapy: Secondary | ICD-10-CM | POA: Insufficient documentation

## 2020-10-10 DIAGNOSIS — F17210 Nicotine dependence, cigarettes, uncomplicated: Secondary | ICD-10-CM | POA: Insufficient documentation

## 2020-10-10 DIAGNOSIS — W868XXA Exposure to other electric current, initial encounter: Secondary | ICD-10-CM

## 2020-10-10 DIAGNOSIS — M7150 Other bursitis, not elsewhere classified, unspecified site: Secondary | ICD-10-CM

## 2020-10-10 DIAGNOSIS — K920 Hematemesis: Secondary | ICD-10-CM

## 2020-10-10 DIAGNOSIS — M542 Cervicalgia: Secondary | ICD-10-CM | POA: Insufficient documentation

## 2020-10-10 DIAGNOSIS — T754XXA Electrocution, initial encounter: Secondary | ICD-10-CM | POA: Insufficient documentation

## 2020-10-10 LAB — BASIC METABOLIC PANEL
Anion gap: 15 (ref 5–15)
BUN: 8 mg/dL (ref 6–20)
CO2: 21 mmol/L — ABNORMAL LOW (ref 22–32)
Calcium: 9 mg/dL (ref 8.9–10.3)
Chloride: 98 mmol/L (ref 98–111)
Creatinine, Ser: 0.9 mg/dL (ref 0.61–1.24)
GFR, Estimated: >60 mL/min (ref >60)
Glucose, Bld: 90 mg/dL (ref 70–99)
Potassium: 3.3 mmol/L — ABNORMAL LOW (ref 3.5–5.1)
Sodium: 134 mmol/L — ABNORMAL LOW (ref 135–145)

## 2020-10-10 LAB — CBC
HCT: 36.4 % — ABNORMAL LOW (ref 39.0–52.0)
Hemoglobin: 12.9 g/dL — ABNORMAL LOW (ref 13.0–17.0)
MCH: 32.6 pg (ref 26.0–34.0)
MCHC: 35.4 g/dL (ref 30.0–36.0)
MCV: 91.9 fL (ref 80.0–100.0)
Platelets: 245 10*3/uL (ref 150–400)
RBC: 3.96 MIL/uL — ABNORMAL LOW (ref 4.22–5.81)
RDW: 12.8 % (ref 11.5–15.5)
WBC: 13.5 10*3/uL — ABNORMAL HIGH (ref 4.0–10.5)
nRBC: 0 % (ref 0.0–0.2)

## 2020-10-10 LAB — URINALYSIS, ROUTINE W REFLEX MICROSCOPIC
Bilirubin Urine: NEGATIVE
Glucose, UA: NEGATIVE mg/dL
Hgb urine dipstick: NEGATIVE
Ketones, ur: NEGATIVE mg/dL
Leukocytes,Ua: NEGATIVE
Nitrite: NEGATIVE
Protein, ur: NEGATIVE mg/dL
Specific Gravity, Urine: 1.012 (ref 1.005–1.030)
pH: 5 (ref 5.0–8.0)

## 2020-10-10 LAB — HEPATIC FUNCTION PANEL
ALT: 15 U/L (ref 0–44)
AST: 37 U/L (ref 15–41)
Albumin: 4.4 g/dL (ref 3.5–5.0)
Alkaline Phosphatase: 69 U/L (ref 38–126)
Bilirubin, Direct: <0.1 mg/dL (ref 0.0–0.2)
Total Bilirubin: 0.3 mg/dL (ref 0.3–1.2)
Total Protein: 8.1 g/dL (ref 6.5–8.1)

## 2020-10-10 LAB — LIPASE, BLOOD: Lipase: 20 U/L (ref 11–51)

## 2020-10-10 MED ORDER — ONDANSETRON HCL 4 MG PO TABS
4.0000 mg | ORAL_TABLET | Freq: Four times a day (QID) | ORAL | 0 refills | Status: DC
Start: 2020-10-10 — End: 2020-10-31

## 2020-10-10 MED ORDER — SODIUM CHLORIDE 0.9 % IV BOLUS
1000.0000 mL | Freq: Once | INTRAVENOUS | Status: AC
  Administered 2020-10-10: 1000 mL via INTRAVENOUS

## 2020-10-10 MED ORDER — METOCLOPRAMIDE HCL 5 MG/ML IJ SOLN
10.0000 mg | Freq: Once | INTRAMUSCULAR | Status: AC
  Administered 2020-10-10: 10 mg via INTRAVENOUS
  Filled 2020-10-10: qty 2

## 2020-10-10 MED ORDER — IOHEXOL 300 MG/ML  SOLN
100.0000 mL | Freq: Once | INTRAMUSCULAR | Status: AC | PRN
  Administered 2020-10-10: 100 mL via INTRAVENOUS

## 2020-10-10 MED ORDER — DICYCLOMINE HCL 10 MG/ML IM SOLN
20.0000 mg | Freq: Once | INTRAMUSCULAR | Status: AC
  Administered 2020-10-10: 20 mg via INTRAMUSCULAR
  Filled 2020-10-10: qty 2

## 2020-10-10 MED ORDER — DIPHENHYDRAMINE HCL 50 MG/ML IJ SOLN
25.0000 mg | Freq: Once | INTRAMUSCULAR | Status: AC
  Administered 2020-10-10: 25 mg via INTRAVENOUS
  Filled 2020-10-10: qty 1

## 2020-10-10 MED ORDER — PANTOPRAZOLE SODIUM 20 MG PO TBEC
40.0000 mg | DELAYED_RELEASE_TABLET | Freq: Every day | ORAL | 0 refills | Status: DC
Start: 2020-10-10 — End: 2020-10-31

## 2020-10-10 MED ORDER — PANTOPRAZOLE SODIUM 40 MG IV SOLR
40.0000 mg | Freq: Once | INTRAVENOUS | Status: AC
  Administered 2020-10-10: 40 mg via INTRAVENOUS
  Filled 2020-10-10: qty 40
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
## 2020-10-30 ENCOUNTER — Encounter (HOSPITAL_COMMUNITY): Payer: Self-pay | Admitting: Emergency Medicine

## 2020-10-30 ENCOUNTER — Emergency Department (HOSPITAL_COMMUNITY)
Admission: EM | Admit: 2020-10-30 | Discharge: 2020-10-31 | Disposition: A | Discharge status: Other Acute Inpatient Hospital | Payer: Self-pay | Attending: Emergency Medicine | Admitting: Emergency Medicine

## 2020-10-30 DIAGNOSIS — Z20822 Contact with and (suspected) exposure to covid-19: Secondary | ICD-10-CM | POA: Insufficient documentation

## 2020-10-30 DIAGNOSIS — Y906 Blood alcohol level of 120-199 mg/100 ml: Secondary | ICD-10-CM | POA: Insufficient documentation

## 2020-10-30 DIAGNOSIS — R45850 Homicidal ideations: Secondary | ICD-10-CM

## 2020-10-30 DIAGNOSIS — F1020 Alcohol dependence, uncomplicated: Secondary | ICD-10-CM | POA: Insufficient documentation

## 2020-10-30 DIAGNOSIS — F17210 Nicotine dependence, cigarettes, uncomplicated: Secondary | ICD-10-CM | POA: Insufficient documentation

## 2020-10-30 DIAGNOSIS — F1029 Alcohol dependence with unspecified alcohol-induced disorder: Secondary | ICD-10-CM | POA: Insufficient documentation

## 2020-10-30 DIAGNOSIS — F332 Major depressive disorder, recurrent severe without psychotic features: Secondary | ICD-10-CM | POA: Insufficient documentation

## 2020-10-30 DIAGNOSIS — F22 Delusional disorders: Secondary | ICD-10-CM

## 2020-10-30 DIAGNOSIS — F329 Major depressive disorder, single episode, unspecified: Secondary | ICD-10-CM | POA: Insufficient documentation

## 2020-10-30 LAB — COMPREHENSIVE METABOLIC PANEL
ALT: 17 U/L (ref 0–44)
AST: 23 U/L (ref 15–41)
Albumin: 3.7 g/dL (ref 3.5–5.0)
Alkaline Phosphatase: 50 U/L (ref 38–126)
Anion gap: 6 (ref 5–15)
BUN: 7 mg/dL (ref 6–20)
CO2: 25 mmol/L (ref 22–32)
Calcium: 8.9 mg/dL (ref 8.9–10.3)
Chloride: 107 mmol/L (ref 98–111)
Creatinine, Ser: 0.95 mg/dL (ref 0.61–1.24)
GFR, Estimated: >60 mL/min (ref >60)
Glucose, Bld: 68 mg/dL — ABNORMAL LOW (ref 70–99)
Potassium: 4 mmol/L (ref 3.5–5.1)
Sodium: 138 mmol/L (ref 135–145)
Total Bilirubin: 0.5 mg/dL (ref 0.3–1.2)
Total Protein: 6.9 g/dL (ref 6.5–8.1)

## 2020-10-30 LAB — CBC
HCT: 38.7 % — ABNORMAL LOW (ref 39.0–52.0)
Hemoglobin: 13.6 g/dL (ref 13.0–17.0)
MCH: 33.1 pg (ref 26.0–34.0)
MCHC: 35.1 g/dL (ref 30.0–36.0)
MCV: 94.2 fL (ref 80.0–100.0)
Platelets: 283 10*3/uL (ref 150–400)
RBC: 4.11 MIL/uL — ABNORMAL LOW (ref 4.22–5.81)
RDW: 13.8 % (ref 11.5–15.5)
WBC: 6 10*3/uL (ref 4.0–10.5)
nRBC: 0 % (ref 0.0–0.2)

## 2020-10-30 LAB — RESP PANEL BY RT-PCR (FLU A&B, COVID) ARPGX2
Influenza A by PCR: NEGATIVE
Influenza B by PCR: NEGATIVE
SARS Coronavirus 2 by RT PCR: NEGATIVE

## 2020-10-30 LAB — RAPID URINE DRUG SCREEN, HOSP PERFORMED
Amphetamines: NOT DETECTED
Barbiturates: NOT DETECTED
Benzodiazepines: NOT DETECTED
Cocaine: NOT DETECTED
Opiates: NOT DETECTED
Tetrahydrocannabinol: POSITIVE — AB

## 2020-10-30 LAB — ACETAMINOPHEN LEVEL: Acetaminophen (Tylenol), Serum: <10 ug/mL — ABNORMAL LOW (ref 10–30)

## 2020-10-30 LAB — SALICYLATE LEVEL: Salicylate Lvl: <7 mg/dL — ABNORMAL LOW (ref 7.0–30.0)

## 2020-10-30 LAB — ETHANOL: Alcohol, Ethyl (B): 122 mg/dL — ABNORMAL HIGH (ref <10)

## 2020-10-30 MED ORDER — PANTOPRAZOLE SODIUM 40 MG PO TBEC
40.0000 mg | DELAYED_RELEASE_TABLET | Freq: Every day | ORAL | Status: DC
  Administered 2020-10-30 – 2020-10-31 (×2): 40 mg via ORAL
  Filled 2020-10-30 (×2): qty 1

## 2020-10-30 MED ORDER — HALOPERIDOL LACTATE 5 MG/ML IJ SOLN
5.0000 mg | Freq: Once | INTRAMUSCULAR | Status: DC
  Filled 2020-10-30: qty 1

## 2020-10-30 MED ORDER — DIPHENHYDRAMINE HCL 50 MG/ML IJ SOLN
50.0000 mg | Freq: Once | INTRAMUSCULAR | Status: DC
  Filled 2020-10-30: qty 1

## 2020-10-30 MED ORDER — LORAZEPAM 1 MG PO TABS
2.0000 mg | ORAL_TABLET | Freq: Once | ORAL | Status: AC
  Administered 2020-10-30: 2 mg via ORAL
  Filled 2020-10-30: qty 2

## 2020-10-30 MED ORDER — ACETAMINOPHEN 325 MG PO TABS
650.0000 mg | ORAL_TABLET | ORAL | Status: DC | PRN
  Administered 2020-10-30: 650 mg via ORAL
  Filled 2020-10-30: qty 2

## 2020-10-30 MED ORDER — HALOPERIDOL 5 MG PO TABS
5.0000 mg | ORAL_TABLET | Freq: Once | ORAL | Status: AC
  Administered 2020-10-30: 5 mg via ORAL
  Filled 2020-10-30: qty 1

## 2020-10-30 MED ORDER — LORAZEPAM 2 MG/ML IJ SOLN
2.0000 mg | Freq: Once | INTRAMUSCULAR | Status: DC
  Filled 2020-10-30: qty 1

## 2020-10-30 MED ORDER — DIPHENHYDRAMINE HCL 25 MG PO CAPS
50.0000 mg | ORAL_CAPSULE | Freq: Once | ORAL | Status: AC
  Administered 2020-10-30: 50 mg via ORAL
  Filled 2020-10-30: qty 2

## 2020-10-30 NOTE — ED Notes (Signed)
TTS in progress 

## 2020-10-30 NOTE — ED Notes (Signed)
Pt ambulated well to his room. Pt was provided with a cup to do urin sample. Talked to Doctor Freida Busman about pt being in purple so he is aware.
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
## 2020-10-30 NOTE — ED Notes (Signed)
Patient changed in scrubs and wand patient and belongings bags.

## 2020-10-30 NOTE — ED Notes (Signed)
TTS complete 
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
## 2020-10-30 NOTE — ED Notes (Signed)
Pt changed into burgundy scrubs and all belongings bagged and labeled.  Security called to wand pt and belongings.

## 2020-10-30 NOTE — ED Notes (Signed)
Awaiting officer to come serve IVC paper work
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
## 2020-10-30 NOTE — BH Assessment (Signed)
Disposition Nira Conn, NP, patient meets inpatient criteria. Hassie Bruce, pending review for placement at [facility].

## 2020-10-31 ENCOUNTER — Inpatient Hospital Stay (HOSPITAL_COMMUNITY)
Admission: AD | Admit: 2020-10-31 | Discharge: 2020-11-06 | DRG: 881 | Disposition: A | Discharge status: Home / Self Care | Payer: Federal, State, Local not specified - Other | Source: Intra-hospital | Attending: Psychiatry | Admitting: Psychiatry

## 2020-10-31 ENCOUNTER — Encounter (HOSPITAL_COMMUNITY): Payer: Self-pay | Admitting: Nurse Practitioner

## 2020-10-31 ENCOUNTER — Other Ambulatory Visit: Payer: Self-pay

## 2020-10-31 DIAGNOSIS — F333 Major depressive disorder, recurrent, severe with psychotic symptoms: Secondary | ICD-10-CM | POA: Diagnosis not present

## 2020-10-31 DIAGNOSIS — F1029 Alcohol dependence with unspecified alcohol-induced disorder: Secondary | ICD-10-CM | POA: Diagnosis present

## 2020-10-31 DIAGNOSIS — Z20822 Contact with and (suspected) exposure to covid-19: Secondary | ICD-10-CM | POA: Diagnosis present

## 2020-10-31 DIAGNOSIS — F17210 Nicotine dependence, cigarettes, uncomplicated: Secondary | ICD-10-CM | POA: Diagnosis present

## 2020-10-31 DIAGNOSIS — G4700 Insomnia, unspecified: Secondary | ICD-10-CM | POA: Diagnosis present

## 2020-10-31 DIAGNOSIS — F332 Major depressive disorder, recurrent severe without psychotic features: Secondary | ICD-10-CM | POA: Diagnosis present

## 2020-10-31 DIAGNOSIS — F1010 Alcohol abuse, uncomplicated: Secondary | ICD-10-CM | POA: Diagnosis present

## 2020-10-31 DIAGNOSIS — I10 Essential (primary) hypertension: Secondary | ICD-10-CM | POA: Diagnosis present

## 2020-10-31 DIAGNOSIS — K219 Gastro-esophageal reflux disease without esophagitis: Secondary | ICD-10-CM | POA: Diagnosis present

## 2020-10-31 DIAGNOSIS — Y906 Blood alcohol level of 120-199 mg/100 ml: Secondary | ICD-10-CM | POA: Diagnosis present

## 2020-10-31 DIAGNOSIS — F10239 Alcohol dependence with withdrawal, unspecified: Secondary | ICD-10-CM | POA: Diagnosis present

## 2020-10-31 DIAGNOSIS — F411 Generalized anxiety disorder: Secondary | ICD-10-CM | POA: Diagnosis present

## 2020-10-31 DIAGNOSIS — Z818 Family history of other mental and behavioral disorders: Secondary | ICD-10-CM

## 2020-10-31 DIAGNOSIS — F4310 Post-traumatic stress disorder, unspecified: Secondary | ICD-10-CM | POA: Diagnosis present

## 2020-10-31 DIAGNOSIS — F1210 Cannabis abuse, uncomplicated: Secondary | ICD-10-CM | POA: Diagnosis present

## 2020-10-31 DIAGNOSIS — F32A Depression, unspecified: Secondary | ICD-10-CM | POA: Diagnosis present

## 2020-10-31 MED ORDER — ADULT MULTIVITAMIN W/MINERALS CH
1.0000 | ORAL_TABLET | Freq: Every day | ORAL | Status: DC
  Administered 2020-11-01 – 2020-11-06 (×6): 1 via ORAL
  Filled 2020-10-31 (×8): qty 1

## 2020-10-31 MED ORDER — ONDANSETRON 4 MG PO TBDP: 4.0000 mg | ORAL_TABLET | Freq: Four times a day (QID) | ORAL | Status: AC | PRN

## 2020-10-31 MED ORDER — THIAMINE HCL 100 MG PO TABS
100.0000 mg | ORAL_TABLET | Freq: Every day | ORAL | Status: DC
  Administered 2020-11-01 – 2020-11-06 (×6): 100 mg via ORAL
  Filled 2020-10-31 (×8): qty 1

## 2020-10-31 MED ORDER — LORAZEPAM 2 MG/ML IJ SOLN: 2.0000 mg | Freq: Two times a day (BID) | INTRAMUSCULAR | Status: DC | PRN

## 2020-10-31 MED ORDER — LORAZEPAM 1 MG PO TABS
1.0000 mg | ORAL_TABLET | Freq: Every day | ORAL | Status: AC
  Administered 2020-11-03: 1 mg via ORAL
  Filled 2020-10-31: qty 1

## 2020-10-31 MED ORDER — ALUM & MAG HYDROXIDE-SIMETH 200-200-20 MG/5ML PO SUSP: 30.0000 mL | ORAL | Status: DC | PRN

## 2020-10-31 MED ORDER — PANTOPRAZOLE SODIUM 40 MG PO TBEC
40.0000 mg | DELAYED_RELEASE_TABLET | Freq: Every day | ORAL | Status: DC
  Administered 2020-11-01 – 2020-11-06 (×6): 40 mg via ORAL
  Filled 2020-10-31 (×7): qty 1

## 2020-10-31 MED ORDER — NICOTINE 21 MG/24HR TD PT24
21.0000 mg | MEDICATED_PATCH | Freq: Every day | TRANSDERMAL | Status: DC
  Administered 2020-11-01 – 2020-11-05 (×5): 21 mg via TRANSDERMAL
  Filled 2020-10-31 (×8): qty 1

## 2020-10-31 MED ORDER — LOPERAMIDE HCL 2 MG PO CAPS: 2.0000 mg | ORAL_CAPSULE | ORAL | Status: AC | PRN

## 2020-10-31 MED ORDER — MAGNESIUM HYDROXIDE 400 MG/5ML PO SUSP: 30.0000 mL | Freq: Every day | ORAL | Status: DC | PRN

## 2020-10-31 MED ORDER — HYDROXYZINE HCL 25 MG PO TABS
25.0000 mg | ORAL_TABLET | Freq: Three times a day (TID) | ORAL | Status: DC | PRN
Start: 2020-10-31 — End: 2020-11-04
  Administered 2020-10-31 – 2020-11-03 (×3): 25 mg via ORAL
  Filled 2020-10-31 (×3): qty 1

## 2020-10-31 MED ORDER — LORAZEPAM 1 MG PO TABS: 1.0000 mg | ORAL_TABLET | Freq: Four times a day (QID) | ORAL | Status: AC | PRN

## 2020-10-31 MED ORDER — LORAZEPAM 1 MG PO TABS
1.0000 mg | ORAL_TABLET | Freq: Three times a day (TID) | ORAL | Status: AC
  Administered 2020-11-01 (×3): 1 mg via ORAL
  Filled 2020-10-31 (×3): qty 1

## 2020-10-31 MED ORDER — TRAZODONE HCL 50 MG PO TABS
50.0000 mg | ORAL_TABLET | Freq: Every evening | ORAL | Status: DC | PRN
  Administered 2020-10-31 – 2020-11-05 (×5): 50 mg via ORAL
  Filled 2020-10-31 (×5): qty 1

## 2020-10-31 MED ORDER — ZIPRASIDONE MESYLATE 20 MG IM SOLR: 20.0000 mg | Freq: Two times a day (BID) | INTRAMUSCULAR | Status: DC | PRN

## 2020-10-31 MED ORDER — ACETAMINOPHEN 325 MG PO TABS
650.0000 mg | ORAL_TABLET | Freq: Four times a day (QID) | ORAL | Status: DC | PRN
  Administered 2020-11-01 – 2020-11-06 (×6): 650 mg via ORAL
  Filled 2020-10-31 (×6): qty 2

## 2020-10-31 MED ORDER — LORAZEPAM 1 MG PO TABS
1.0000 mg | ORAL_TABLET | Freq: Two times a day (BID) | ORAL | Status: AC
  Administered 2020-11-02 (×2): 1 mg via ORAL
  Filled 2020-10-31 (×2): qty 1

## 2020-10-31 MED ORDER — LORAZEPAM 1 MG PO TABS
1.0000 mg | ORAL_TABLET | Freq: Four times a day (QID) | ORAL | Status: AC
  Administered 2020-10-31 (×2): 1 mg via ORAL
  Filled 2020-10-31 (×2): qty 1

## 2020-10-31 NOTE — ED Notes (Signed)
Patient's girlfriend called and left her phone number.  She would like for patient to give her a call.  [phone].
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
## 2020-10-31 NOTE — Progress Notes (Addendum)
Writer spoke with Debbe Odea (TTS) and Selena Batten [facility]) regarding patient's acceptance.  Due to miscommunication, patient's new arrival time will be after 10am.

## 2020-10-31 NOTE — ED Notes (Signed)
Report called to [facility] and [facility] called for transport °

## 2020-10-31 NOTE — Progress Notes (Signed)
Pt very emotional on the unit this evening. 1:1 time spent with pt to help with SA issues and coping with situations in his life     10/31/20 2300  Psych Admission Type (Psych Patients Only)  Admission Status Involuntary  Psychosocial Assessment  Patient Complaints Anxiety  Eye Contact Brief  Facial Expression Animated;Pained;Sullen;Sad  Affect Anxious;Irritable  Speech Aggressive  Interaction Assertive  Motor Activity Slow  Appearance/Hygiene In scrubs  Behavior Characteristics Anxious  Thought Process  Coherency WDL  Content WDL  Delusions WDL;None reported or observed  Perception WDL  Hallucination None reported or observed  Judgment Impaired  Confusion WDL  Danger to Self  Current suicidal ideation? Denies  Danger to Others  Danger to Others None reported or observed

## 2020-10-31 NOTE — Tx Team (Signed)
Initial Treatment Plan 10/31/2020 4:22 PM [name] [facility]:[id number]    PATIENT STRESSORS: Marital or family conflict Medication change or noncompliance Substance abuse Traumatic event   PATIENT STRENGTHS: Communication skills General fund of knowledge Supportive family/friends   PATIENT IDENTIFIED PROBLEMS: Substance use - "I have to get off this alcohol"  "I need to focus on my independence"  Depression  Anxiety               DISCHARGE CRITERIA:  Ability to meet basic life and health needs Adequate post-discharge living arrangements Improved stabilization in mood, thinking, and/or behavior  PRELIMINARY DISCHARGE PLAN: Attend PHP/IOP Attend 12-step recovery group Placement in alternative living arrangements  PATIENT/FAMILY INVOLVEMENT: This treatment plan has been presented to and reviewed with the patient, Alexander Crawford.  The patient has been given the opportunity to ask questions and make suggestions.  Garnette Scheuermann, RN 10/31/2020, 4:22 PM

## 2020-10-31 NOTE — BH Assessment (Addendum)
Nira Conn, NP, patient meets inpatient criteria. Hassie Bruce, accepted [facility] Room [phone] Attending is Dr. Jola Babinski Arrival time is now. Everardo Pacific, RN, informed of acceptance Report [phone]

## 2020-11-01 DIAGNOSIS — F1010 Alcohol abuse, uncomplicated: Secondary | ICD-10-CM | POA: Diagnosis present

## 2020-11-01 DIAGNOSIS — F333 Major depressive disorder, recurrent, severe with psychotic symptoms: Secondary | ICD-10-CM

## 2020-11-01 DIAGNOSIS — F32A Depression, unspecified: Secondary | ICD-10-CM | POA: Diagnosis present

## 2020-11-01 DIAGNOSIS — F1210 Cannabis abuse, uncomplicated: Secondary | ICD-10-CM | POA: Diagnosis present

## 2020-11-01 LAB — LIPID PANEL
Cholesterol: 192 mg/dL (ref 0–200)
HDL: 50 mg/dL (ref >40)
LDL Cholesterol: 124 mg/dL — ABNORMAL HIGH (ref 0–99)
Total CHOL/HDL Ratio: 3.8 RATIO
Triglycerides: 90 mg/dL (ref <150)
VLDL: 18 mg/dL (ref 0–40)

## 2020-11-01 LAB — HEMOGLOBIN A1C
Hgb A1c MFr Bld: 5.3 % (ref 4.8–5.6)
Mean Plasma Glucose: 105.41 mg/dL

## 2020-11-01 LAB — TSH: TSH: 1.889 u[IU]/mL (ref 0.350–4.500)

## 2020-11-01 MED ORDER — QUETIAPINE FUMARATE 100 MG PO TABS
100.0000 mg | ORAL_TABLET | Freq: Every day | ORAL | Status: DC
  Administered 2020-11-01: 100 mg via ORAL
  Filled 2020-11-01 (×3): qty 1

## 2020-11-01 MED ORDER — ESCITALOPRAM OXALATE 10 MG PO TABS
10.0000 mg | ORAL_TABLET | Freq: Every day | ORAL | Status: DC
  Administered 2020-11-01 – 2020-11-06 (×6): 10 mg via ORAL
  Filled 2020-11-01 (×8): qty 1

## 2020-11-01 NOTE — Progress Notes (Signed)
Pt stated he was feeling a little better, pt visible in dayroom some this evening. Pt given PRN Trazodone and Vistaril per [facility] with scheduled 100 mg Seroquel    11/01/20 2200  Psych Admission Type (Psych Patients Only)  Admission Status Involuntary  Psychosocial Assessment  Patient Complaints Anxiety  Eye Contact Brief  Facial Expression Animated;Pained;Sullen;Sad  Affect Anxious;Irritable  Speech Aggressive  Interaction Assertive  Motor Activity Slow  Appearance/Hygiene In scrubs  Behavior Characteristics Cooperative  Mood Anxious  Thought Process  Coherency WDL  Content WDL  Delusions WDL;None reported or observed  Perception WDL  Hallucination None reported or observed  Judgment Impaired  Confusion WDL  Danger to Self  Current suicidal ideation? Denies  Danger to Others  Danger to Others None reported or observed

## 2020-11-01 NOTE — [facility] Group Notes (Signed)
The focus of this group is to help patients establish daily goals to achieve during treatment and discuss how the patient can incorporate goal setting into their daily lives to aide in recovery.  Pt attended group, Pt did not come up with a goal for today.
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
## 2020-11-01 NOTE — Progress Notes (Signed)
   11/01/20 0500  Sleep  Number of Hours 5.75

## 2020-11-01 NOTE — [facility] Counselor (Signed)
CSW provided this patient with information around residential programs to review for possible treatment post discharge.   Ruthann Cancer MSW, LCSW Clincal Social Worker  [facility]
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
## 2020-11-01 NOTE — Progress Notes (Signed)
Dar Note: Patient presents with a flat affect and depressed mood.  Denies suicidal thoughts, auditory and visual hallucinations.  Medication given as prescribed.  Routine safety checks maintained.  Patient visible in milieu briefly.  Minimal interaction with staff and peers.  Withdrawal symptoms reported or noted.  Patient is safe on and off the unit.  No complaints reported.
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
## 2020-11-01 NOTE — [facility] Group Notes (Signed)
Occupational Therapy Group Note Date: 11/01/2020 Group Topic/Focus: Feelings Management  Group Description: Group encouraged increased engagement and participation through discussion focused on Building Happiness. Patients were given a worksheet and engaged in interactive discussion focused on six strategies to build happiness and improve positive emotions including gratitudes, acts of kindness, exercise, meditation, positive journaling, and fostering relationships. Patients explored their experience in the six categories and shared the impact these experiences have had on their mood.  Participation Level: Patient did not attend OT group session despite personal invitation from RN. Pt remained in room asleep.    Plan: Continue to engage patient in OT groups 2 - 3x/week.  11/01/2020  Donne Hazel, MOT, OTR/L

## 2020-11-02 DIAGNOSIS — F1029 Alcohol dependence with unspecified alcohol-induced disorder: Secondary | ICD-10-CM

## 2020-11-02 DIAGNOSIS — F333 Major depressive disorder, recurrent, severe with psychotic symptoms: Secondary | ICD-10-CM | POA: Diagnosis not present

## 2020-11-02 MED ORDER — MIRTAZAPINE 7.5 MG PO TABS
7.5000 mg | ORAL_TABLET | Freq: Every day | ORAL | Status: DC
  Administered 2020-11-02 – 2020-11-05 (×4): 7.5 mg via ORAL
  Filled 2020-11-02 (×6): qty 1

## 2020-11-02 MED ORDER — QUETIAPINE FUMARATE 200 MG PO TABS
200.0000 mg | ORAL_TABLET | Freq: Every day | ORAL | Status: DC
  Administered 2020-11-02 – 2020-11-05 (×4): 200 mg via ORAL
  Filled 2020-11-02 (×6): qty 1

## 2020-11-02 NOTE — [facility] Group Notes (Signed)
Pt attended wrap up group on 10/1720 with this Clinical research associate.  Pt participate by sharing a few of his answers with explanation  from "Strengths Exploration" worksheet. 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  Pt spoke about meeting Maya Angelou at the age of 89.  Pt. also shared about how his overall day went.  Pt did well.           Note Details

## 2020-11-02 NOTE — BHH Counselor (Signed)
CSW spoke with this patients mother, [name] ([phone]) who reported that she feels that what triggered this patient to come to the hospital is his dog being taken away earlier in the week. [name] has no safety concerns for this patient at discharge.   Ruthann Cancer MSW, LCSW Clincal Social Worker  [facility]

## 2020-11-02 NOTE — Progress Notes (Signed)
°   11/02/20 1400  Psych Admission Type (Psych Patients Only)  Admission Status Involuntary  Psychosocial Assessment  Patient Complaints Depression  Eye Contact Brief  Facial Expression Animated;Pained;Sullen;Sad  Affect Anxious;Irritable  Speech Aggressive  Interaction Assertive  Motor Activity Slow  Appearance/Hygiene In scrubs  Behavior Characteristics Cooperative  Mood Depressed  Thought Process  Coherency WDL  Content WDL  Delusions WDL;None reported or observed  Perception WDL  Hallucination None reported or observed  Judgment Impaired  Confusion WDL  Danger to Self  Current suicidal ideation? Denies  Danger to Others  Danger to Others None reported or observed

## 2020-11-02 NOTE — Tx Team (Signed)
Interdisciplinary Treatment and Diagnostic Plan Update  11/02/2020 Time of Session:  Alexander Crawford MRN: [id number]  Principal Diagnosis: Depression, unspecified  Secondary Diagnoses: Principal Problem:   Depression, unspecified Active Problems:   Alcohol dependence with unspecified alcohol-induced disorder ([place])   Marijuana abuse   Current Medications:  Current Facility-Administered Medications  Medication Dose Route Frequency Provider Last Rate Last Admin   acetaminophen (TYLENOL) tablet 650 mg  650 mg Oral Q6H PRN Lindon Romp A, NP   650 mg at 11/01/20 1753   alum & mag hydroxide-simeth (MAALOX/MYLANTA) 200-200-20 MG/5ML suspension 30 mL  30 mL Oral Q4H PRN Rozetta Nunnery, NP       escitalopram (LEXAPRO) tablet 10 mg  10 mg Oral Daily Nelda Marseille, Amy E, MD   10 mg at 11/02/20 0818   hydrOXYzine (ATARAX/VISTARIL) tablet 25 mg  25 mg Oral TID PRN Rozetta Nunnery, NP   25 mg at 11/01/20 2057   loperamide (IMODIUM) capsule 2-4 mg  2-4 mg Oral PRN Rozetta Nunnery, NP       ziprasidone (GEODON) injection 20 mg  20 mg Intramuscular Q12H PRN Lindon Romp A, NP       And   LORazepam (ATIVAN) injection 2 mg  2 mg Intramuscular Q12H PRN Lindon Romp A, NP       LORazepam (ATIVAN) tablet 1 mg  1 mg Oral Q6H PRN Lindon Romp A, NP       LORazepam (ATIVAN) tablet 1 mg  1 mg Oral BID Lindon Romp A, NP   1 mg at 11/02/20 0818   Followed by   Derrill Memo ON 11/03/2020] LORazepam (ATIVAN) tablet 1 mg  1 mg Oral Daily Lindon Romp A, NP       magnesium hydroxide (MILK OF MAGNESIA) suspension 30 mL  30 mL Oral Daily PRN Rozetta Nunnery, NP       multivitamin with minerals tablet 1 tablet  1 tablet Oral Daily Lindon Romp A, NP   1 tablet at 11/02/20 0817   nicotine (NICODERM CQ - dosed in mg/24 hours) patch 21 mg  21 mg Transdermal Daily Ethelene Hal, NP   21 mg at 11/02/20 0817   ondansetron (ZOFRAN-ODT) disintegrating tablet 4 mg  4 mg Oral Q6H PRN Rozetta Nunnery, NP       pantoprazole (PROTONIX) EC  tablet 40 mg  40 mg Oral Daily Lindon Romp A, NP   40 mg at 11/02/20 0817   QUEtiapine (SEROQUEL) tablet 100 mg  100 mg Oral QHS Singleton, Amy E, MD   100 mg at 11/01/20 2057   thiamine tablet 100 mg  100 mg Oral Daily Lindon Romp A, NP   100 mg at 11/02/20 0818   traZODone (DESYREL) tablet 50 mg  50 mg Oral QHS PRN Rozetta Nunnery, NP   50 mg at 11/01/20 2057   PTA Medications: Medications Prior to Admission  Medication Sig Dispense Refill Last Dose   albuterol (VENTOLIN HFA) 108 (90 Base) MCG/ACT inhaler Inhale 2 puffs into the lungs every 6 (six) hours as needed for wheezing or shortness of breath.      acetaminophen (TYLENOL) 500 MG tablet Take 1,000 mg by mouth every 6 (six) hours as needed for moderate pain.       Patient Stressors: Marital or family conflict Medication change or noncompliance Substance abuse Traumatic event  Patient Strengths: Curator fund of knowledge Supportive family/friends  Treatment Modalities: Medication Management, Group therapy, Case management,  1 to 1  session with clinician, Psychoeducation, Recreational therapy.   Physician Treatment Plan for Primary Diagnosis: Depression, unspecified Long Term Goal(s): Improvement in symptoms so as ready for discharge Improvement in symptoms so as ready for discharge   Short Term Goals: Ability to identify changes in lifestyle to reduce recurrence of condition will improve Ability to demonstrate self-control will improve Ability to identify and develop effective coping behaviors will improve Ability to identify triggers associated with substance abuse/mental health issues will improve  Medication Management: Evaluate patient's response, side effects, and tolerance of medication regimen.  Therapeutic Interventions: 1 to 1 sessions, Unit Group sessions and Medication administration.  Evaluation of Outcomes: Not Met  Physician Treatment Plan for Secondary Diagnosis: Principal Problem:    Depression, unspecified Active Problems:   Alcohol dependence with unspecified alcohol-induced disorder ([place])   Marijuana abuse  Long Term Goal(s): Improvement in symptoms so as ready for discharge Improvement in symptoms so as ready for discharge   Short Term Goals: Ability to identify changes in lifestyle to reduce recurrence of condition will improve Ability to demonstrate self-control will improve Ability to identify and develop effective coping behaviors will improve Ability to identify triggers associated with substance abuse/mental health issues will improve     Medication Management: Evaluate patient's response, side effects, and tolerance of medication regimen.  Therapeutic Interventions: 1 to 1 sessions, Unit Group sessions and Medication administration.  Evaluation of Outcomes: Not Met   RN Treatment Plan for Primary Diagnosis: Depression, unspecified Long Term Goal(s): Knowledge of disease and therapeutic regimen to maintain health will improve  Short Term Goals: Ability to participate in decision making will improve, Ability to verbalize feelings will improve and Ability to identify and develop effective coping behaviors will improve  Medication Management: RN will administer medications as ordered by provider, will assess and evaluate patient's response and provide education to patient for prescribed medication. RN will report any adverse and/or side effects to prescribing provider.  Therapeutic Interventions: 1 on 1 counseling sessions, Psychoeducation, Medication administration, Evaluate responses to treatment, Monitor vital signs and CBGs as ordered, Perform/monitor CIWA, COWS, AIMS and Fall Risk screenings as ordered, Perform wound care treatments as ordered.  Evaluation of Outcomes: Not Met   LCSW Treatment Plan for Primary Diagnosis: Depression, unspecified Long Term Goal(s): Safe transition to appropriate next level of care at discharge, Engage patient in  therapeutic group addressing interpersonal concerns.  Short Term Goals: Engage patient in aftercare planning with referrals and resources, Increase social support and Increase ability to appropriately verbalize feelings  Therapeutic Interventions: Assess for all discharge needs, 1 to 1 time with Social worker, Explore available resources and support systems, Assess for adequacy in community support network, Educate family and significant other(s) on suicide prevention, Complete Psychosocial Assessment, Interpersonal group therapy.  Evaluation of Outcomes: Not Met   Progress in Treatment: Attending groups: Yes. Participating in groups: Yes. Taking medication as prescribed: Yes. Toleration medication: Yes. Family/Significant other contact made: Yes, individual(s) contacted:  Erma Heritage  Patient understands diagnosis: Yes. Discussing patient identified problems/goals with staff: Yes. Medical problems stabilized or resolved: Yes. Denies suicidal/homicidal ideation: Yes. Issues/concerns per patient self-inventory: No. Other: None  New problem(s) identified: No, Describe:  None  New Short Term/Long Term Goal(s):medication stabilization, elimination of SI thoughts, development of comprehensive mental wellness plan.  Patient Goals:  Did Not attend  Discharge Plan or Barriers: Patient recently admitted. CSW will continue to follow and assess for appropriate referrals and possible discharge planning.  Reason for Continuation of Hospitalization: Homicidal ideation  Medication stabilization Withdrawal symptoms  Estimated Length of Stay: 3-5 days  Attendees: Patient:  11/02/2020   Physician:  11/02/2020   Nursing:  11/02/2020   RN Care Manager: 11/02/2020   Social Worker: Toney Reil, [place] 11/02/2020   Recreational Therapist:  11/02/2020   Other:  11/02/2020   Other:  11/02/2020   Other: 11/02/2020     Scribe for Treatment Team: Mliss Fritz, Latanya Presser 11/02/2020 9:51 AM

## 2020-11-02 NOTE — Progress Notes (Signed)
Pt came to the nursing station requesting something to help him sleep after receiving HS medications earlier. Pt given PRN Trazodone 50 mg per [facility]
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
## 2020-11-02 NOTE — Progress Notes (Signed)
Pt continues to isolate to his room much of the evening, pt stated he was feeling a little better    11/02/20 2100  Psych Admission Type (Psych Patients Only)  Admission Status Involuntary  Psychosocial Assessment  Patient Complaints Depression  Eye Contact Brief  Facial Expression Animated;Pained;Sullen;Sad  Affect Anxious;Irritable  Speech Aggressive  Interaction Assertive  Motor Activity Slow  Appearance/Hygiene In scrubs  Behavior Characteristics Cooperative  Mood Depressed  Thought Process  Coherency WDL  Content WDL  Delusions WDL;None reported or observed  Perception WDL  Hallucination None reported or observed  Judgment Impaired  Confusion WDL  Danger to Self  Current suicidal ideation? Denies  Danger to Others  Danger to Others None reported or observed

## 2020-11-02 NOTE — [facility] Group Notes (Signed)
[facility] LCSW Group Therapy    Type of Therapy/Topic: Identifying Irrational Beliefs/Thoughts  Participation Level: Active  Description of Group: The purpose of this group is to assist patients in learning to identify irrational beliefs and thoughts that contribute to their negative emotions and experience positive emotions. Patients will be guided to discuss ways in which they have been effected by irrational thoughts and beliefs and how to transform those irrational beliefs into rational ones. Newly identified rational beliefs will be juxtaposed with experiences of positive emotions or situations, and patients will be challenged to use rational beliefs or thoughts to combat negative ones. Special emphasis will be placed on coping with irrational beliefs in conflict situations, and patients will process healthy conflict resolution skills.  Therapeutic Goals: 1. Patient will identify two irrational thoughts or beliefs  to reflect on in order to balance out those thoughts 2. Patient will label two or more irrational thoughts/beliefs that they find the most difficult to cope with 3. Patient will demonstrate positive conflict resolution skills through discussion and/or role plays that will assist in transforming irrational thoughts or beliefs into positive ones.  Summary of Patient Progress: Patient attended and particiapted in group. Patient was able to identify distorted thinking including "no one likes me, I can't forgive myself." Patient was resistant to changing thinking patterns and often attempted to back up his irrational thoughts.    Therapeutic Modalities: Cognitive Behavioral Therapy Feelings Identification Dialectical Behavioral Therapy   Ruthann Cancer MSW, LCSW Clincal Social Worker  [facility]

## 2020-11-03 DIAGNOSIS — F333 Major depressive disorder, recurrent, severe with psychotic symptoms: Secondary | ICD-10-CM | POA: Diagnosis not present

## 2020-11-03 NOTE — Progress Notes (Signed)
Pt stated he was up and down much of the night due to another patient up talking loudly Alexander Crawford)

## 2020-11-03 NOTE — Progress Notes (Signed)
Adult Psychoeducational Group Note  Date:  11/03/2020 Time:  12:05 AM  Group Topic/Focus:  Wrap-Up Group:   The focus of this group is to help patients review their daily goal of treatment and discuss progress on daily workbooks.  Participation Level:  Did Not Attend  Participation Quality:  Did Not Attend  Affect:  Did Not Attend  Cognitive:  Did Not Attend  Insight: None  Engagement in Group:  Did Not Attend  Modes of Intervention:  Did Not Attend  Additional Comments:  Pt did not attend evening wrap up group tonight.  Felipa Furnace 11/03/2020, 12:05 AM

## 2020-11-03 NOTE — Progress Notes (Signed)
   11/03/20 0621  Vital Signs  Pulse Rate (!) 101  BP 126/87  BP Location Right Arm  BP Method Automatic  Patient Position (if appropriate) Standing   D: Patient denies SI/HI/AVH. Pt. Denies anxiety and depression. Pt. Out in open areas watching tv. Pt. Went to group outside. Pt complained of left knee pain 7/10 where he had a metal plate.  A:  Patient took scheduled medicine.  Pt.'s pain was relieved by 650mg  of Tylenol. Support and encouragement provided Routine safety checks conducted every 15 minutes. Patient  Informed to notify staff with any concerns.   R: Safety maintained.

## 2020-11-03 NOTE — Progress Notes (Signed)
   11/03/20 0500  Sleep  Number of Hours 6.75

## 2020-11-03 NOTE — Progress Notes (Addendum)
Adult Psychoeducational Group Note  Date:  11/03/2020 Time:  11:41 PM  Group Topic/Focus:  Wrap-Up Group:   The focus of this group is to help patients review their daily goal of treatment and discuss progress on daily workbooks.  Participation Level:  Active  Participation Quality:  Appropriate  Affect:   Appropriate  Cognitive:   Appropriate  Insight:  Appropriate  Engagement in Group:  Developing/Improving   Modes of Intervention:  Discussion    Additional Comments: Pt stated his goal for today was to focus on his treatment plan and talk with his doctor about his discharge plan. Pt stated he accomplished his goals today. Pt stated the plan is for him to discharge on Monday (11/05/2020). Pt stated he talked with his doctor and social worker about his care today. Pt rated his overall day a 10. Pt stated he was able to contact his mother today which improved his over all day. Pt stated he felt better about himself today. Pt stated he was able to attend all meals. Pt stated he took all medications provided today. Pt stated his appetite was pretty good today. Pt rated sleep last night was poor. Pt stated the goal tonight was to get some rest. Pt stated he had no physical pain today. Pt deny visual hallucinations and auditory issues today. Pt denies thoughts of harming himself or others. Pt stated he would alert staff if anything changed.  Felipa Furnace 11/03/2020, 11:41 PM

## 2020-11-03 NOTE — [facility] Group Notes (Signed)
.  Psychoeducational Group Note  Date: 11-03-2020 Time: 0900-1000    Goal Setting   Purpose of Group: This group helps to provide patients with the steps of setting a goal that is specific, measurable, attainable, realistic and time specific. A discussion on how we keep ourselves stuck with negative self talk.    Participation Level:  Did not attend   Council Munguia A  

## 2020-11-03 NOTE — Progress Notes (Signed)
Pt stated he was doing better, the medications working well for him. Pt stated he wanted to continue with his writing.     11/03/20 2100  Psych Admission Type (Psych Patients Only)  Admission Status Involuntary  Psychosocial Assessment  Patient Complaints None  Eye Contact Brief  Facial Expression Animated;Pained;Sullen;Sad  Affect Anxious;Irritable  Speech Aggressive  Interaction Assertive  Motor Activity Slow  Appearance/Hygiene In scrubs  Behavior Characteristics Cooperative  Mood Pleasant  Thought Process  Coherency WDL  Content WDL  Delusions WDL;None reported or observed  Perception WDL  Hallucination None reported or observed  Judgment Impaired  Confusion WDL  Danger to Self  Current suicidal ideation? Denies  Danger to Others  Danger to Others None reported or observed

## 2020-11-04 DIAGNOSIS — F333 Major depressive disorder, recurrent, severe with psychotic symptoms: Secondary | ICD-10-CM | POA: Diagnosis not present

## 2020-11-04 MED ORDER — IBUPROFEN 600 MG PO TABS
600.0000 mg | ORAL_TABLET | Freq: Four times a day (QID) | ORAL | Status: DC | PRN
  Administered 2020-11-04 – 2020-11-05 (×3): 600 mg via ORAL
  Filled 2020-11-04 (×3): qty 1

## 2020-11-04 MED ORDER — HYDROXYZINE HCL 25 MG PO TABS
25.0000 mg | ORAL_TABLET | Freq: Three times a day (TID) | ORAL | Status: DC | PRN
  Administered 2020-11-04 – 2020-11-05 (×2): 25 mg via ORAL
  Filled 2020-11-04 (×2): qty 1

## 2020-11-04 MED ORDER — CLONIDINE HCL 0.1 MG PO TABS
0.1000 mg | ORAL_TABLET | Freq: Three times a day (TID) | ORAL | Status: DC | PRN
  Administered 2020-11-04 – 2020-11-05 (×2): 0.1 mg via ORAL
  Filled 2020-11-04 (×2): qty 1

## 2020-11-04 NOTE — Progress Notes (Signed)
Patient has been up in the dayroom interacting with peers. He is in a jovial mood laughing and talking. He was compliant with his medications but c/o left knee pain with slight swelling. Writer informed him that I would get an order for ibuprofen to help with the swelling. Support given and safety maintained with 15 min checks.

## 2020-11-04 NOTE — Progress Notes (Addendum)
Patient presents with an anxious mood. He rates depression 3/10 and anxiety 1/10 (10 being worst). He denies SI/HI. He denies AVH today. He reports minimal withdrawal symptoms of chills/sweats, agitation and tremors. CIWA 2. He was offered vistaril. He's been hypertensive throughout the day and Dr. Mason Jim was made aware. Clonidine ordered prn for elevated b/p. Patient stated goal "keeping a clear and open mind. Strengthen communication with others rather than isolation."   Orders reviewed. Vital signs reviewed and reassessed q four hours. Verbal support provided. 15 minute checks performed for safety.   Patient compliant with treatment plan. Patient denies side effects to medications.

## 2020-11-04 NOTE — [facility] Group Notes (Signed)
Adult Psychoeducational Group Not Date:  11/04/2020 Time:  8937-3428 Group Topic/Focus: PROGRESSIVE RELAXATION. A group where deep breathing is taught and tensing and relaxation muscle groups is used. Imagery is used as well.  Pts are asked to imagine 3 pillars that hold them up when they are not able to hold themselves up.  Participation Level:  Active  Participation Quality:  Appropriate  Affect:  Appropriate  Cognitive:  Oriented  Insight: Improving  Engagement in Group:  Engaged  Modes of Intervention:  Activity, Discussion, Education, and Support  Additional Comments:  Pt rates his energy level as a 8.5. States it is himself who holds him up  Vira Blanco A
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
## 2020-11-04 NOTE — Progress Notes (Signed)
°   11/04/20 2045  COVID-19 Daily Checkoff  Have you had a fever (temp > 37.80C/100F)  in the past 24 hours?  No  If you have had runny nose, nasal congestion, sneezing in the past 24 hours, has it worsened? No  COVID-19 EXPOSURE  Have you traveled outside the state in the past 14 days? No  Have you been in contact with someone with a confirmed diagnosis of COVID-19 or PUI in the past 14 days without wearing appropriate PPE? No  Have you been living in the same home as a person with confirmed diagnosis of COVID-19 or a PUI (household contact)? No  Have you been diagnosed with COVID-19? No   

## 2020-11-04 NOTE — Progress Notes (Signed)
   11/04/20 0604  Vital Signs  Temp 98.1 F (36.7 C)  Temp Source Oral  Pulse Rate 77  Pulse Rate Source Monitor  Resp 18  BP (!) 149/103  BP Location Right Arm  BP Method Automatic  Patient Position (if appropriate) Sitting  Oxygen Therapy  SpO2 98 %  Sleep  Number of Hours 5.25

## 2020-11-05 DIAGNOSIS — F333 Major depressive disorder, recurrent, severe with psychotic symptoms: Secondary | ICD-10-CM | POA: Diagnosis not present

## 2020-11-05 MED ORDER — ONDANSETRON 4 MG PO TBDP: 4.0000 mg | ORAL_TABLET | Freq: Four times a day (QID) | ORAL | Status: DC | PRN

## 2020-11-05 MED ORDER — LORAZEPAM 1 MG PO TABS: 1.0000 mg | ORAL_TABLET | Freq: Four times a day (QID) | ORAL | Status: DC | PRN

## 2020-11-05 MED ORDER — LOPERAMIDE HCL 2 MG PO CAPS: 2.0000 mg | ORAL_CAPSULE | ORAL | Status: DC | PRN
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
## 2020-11-05 NOTE — Progress Notes (Signed)
Recreation Therapy Notes  Date: 3.21.22 Time: 1000 Location: 500 Hall Dayroom  Group Topic: Coping Skills  Goal Area(s) Addresses:  Patient will identify positive coping skills. Patient will identify benefit of coping skills. Patient will identify benefit of using coping skills post d/c.  Behavioral Response: Engaged  Intervention: Mind map, Pencils, [employer] Erase Markers  Activity:  Coping Skills.  Patients were given a blank mind map.  LRT and patients filled in the first 8 boxes together with situations (depression, anxiety, social association, stress, eating disorder, self care, finances and education) in which coping skills could be used.  Patients were then given time to come up with at least 3 coping skills for each situation identified.  After this, the group would come back together and LRT would write the responses on the board.    Education: Pharmacologist, Building control surveyor.   Education Outcome: Acknowledges understanding/In group clarification offered/Needs additional education.   Clinical Observations/Feedback: Pt was very engaged and attentive in group.  Pt socialized well with peers.  Pt was able to come with some coping skills such as medication, meditation, breathing exercises, talk about problem, respect others, accepting yourself, understand differences, exercise, therapy, education, diet structure, learn who you are, self interests, math, charity, set attainable goals, comprehension, understanding and being open minded.    Caroll Rancher, LRT/CTRS    Caroll Rancher A 11/05/2020 11:54 AM

## 2020-11-05 NOTE — Progress Notes (Signed)
Adult Psychoeducational Group Note  Date:  11/05/2020 Time:  11:39 PM  Group Topic/Focus:  Wrap-Up Group:   The focus of this group is to help patients review their daily goal of treatment and discuss progress on daily workbooks.  Participation Level:  Active  Participation Quality:  Appropriate  Affect:  Appropriate  Cognitive:  Appropriate  Insight: Appropriate  Engagement in Group:  Limited  Modes of Intervention:  Discussion  Additional Comments:Pt stated his goal for today was to focus on his treatment plan and talk with his doctor about his blood pressure issues. Pt stated he accomplished his goals today.  Pt stated he talked with his doctor and his social worker about his care today. Pt rated his overall day a10. Pt stated hewas able to contact his mother and sistertodaywhichimproved his over all day. Pt stated he felt better about himself today. Pt stated he was able to attend all meals. Pt stated he took all medications provided today. Pt stated his appetite was pretty good today. Pt stated sleep last night was pretty good. Pt stated the goal tonight was to get some rest tonight. Pt stated he had no physical pain tonight.  Pt deny visual hallucinations and auditory issues tonight. Pt denies thoughts of harming himself or others. Pt stated he would alert staff if anything changed.     Alexander Crawford 11/05/2020, 11:39 PM

## 2020-11-05 NOTE — Progress Notes (Signed)
DAR NOTE: Patient presents with calm affect and pleasant mood.  Denies suicidal thoughs, auditory and visual hallucinations.  Described energy level as hyper with good concentration.  Rates depression at 2, hopelessness at 0, and anxiety at 0.  Maintained on routine safety checks.  Medications given as prescribed.  Support and encouragement offered as needed.  Attended group and participated.  States goal for today is "creating a better me, knowing who I am, determining direction and creating a better critic."  Patient observed socializing with peers in the dayroom.  Requested and received Motrin 600 mg for complain of left leg pain with good effect.  Patient is safe on and off the unit.
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
## 2020-11-05 NOTE — Progress Notes (Signed)
Adult Psychoeducational Group Note  Date:  11/05/2020 Time:  12:04 AM  Group Topic/Focus:  Wrap-Up Group:   The focus of this group is to help patients review their daily goal of treatment and discuss progress on daily workbooks.  Participation Level:  Active  Participation Quality:  Appropriate  Affect:  Appropriate and Excited  Cognitive:  Appropriate  Insight: Appropriate  Engagement in Group:  Developing/Improving  Modes of Intervention:  Discussion  Additional Comments:  Pt stated his goal for today was to focus on his treatment plan and talk with his doctor about his discharge plan. Pt stated he accomplished his goals today. Pt stated the plan is for him to discharge on Monday (11/05/2020). Pt stated he talked with his doctor but did not get a chance to talk with his social worker about his care today. Pt rated his overall day a 10. Pt stated he was able to contact his mother and sister today which improved his over all day. Pt stated he felt better about himself today. Pt stated he was able to attend all meals. Pt stated he took all medications provided today. Pt stated his appetite was pretty good today. Pt rated sleep last night was fair. Pt stated the goal tonight was to get some rest. Pt stated he had some physical pain tonight. Pt stated he was dealing with some mild pain in his left knee. Pt rated the mild pain in his left knee a 3 on the pain level scale.  Pt deny visual hallucinations and auditory issues today. Pt denies thoughts of harming himself or others. Pt stated he would alert staff if anything changed.  Felipa Furnace 11/05/2020, 12:04 AM

## 2020-11-06 DIAGNOSIS — F32A Depression, unspecified: Secondary | ICD-10-CM | POA: Diagnosis not present

## 2020-11-06 MED ORDER — HYDROXYZINE HCL 25 MG PO TABS
25.0000 mg | ORAL_TABLET | Freq: Three times a day (TID) | ORAL | 0 refills | Status: DC | PRN
Start: 2020-11-06 — End: 2022-09-11

## 2020-11-06 MED ORDER — QUETIAPINE FUMARATE 200 MG PO TABS: 200.0000 mg | ORAL_TABLET | Freq: Every day | ORAL | 0 refills | Status: DC

## 2020-11-06 MED ORDER — TRAZODONE HCL 50 MG PO TABS: 50.0000 mg | ORAL_TABLET | Freq: Every evening | ORAL | 0 refills | Status: DC | PRN

## 2020-11-06 MED ORDER — MIRTAZAPINE 7.5 MG PO TABS: 7.5000 mg | ORAL_TABLET | Freq: Every day | ORAL | 0 refills | Status: DC

## 2020-11-06 MED ORDER — ESCITALOPRAM OXALATE 10 MG PO TABS: 10.0000 mg | ORAL_TABLET | Freq: Every day | ORAL | 0 refills | Status: DC

## 2020-11-06 NOTE — Progress Notes (Signed)
   11/06/20 0627  Vital Signs  Temp 97.9 F (36.6 C)  Temp Source Oral  Pulse Rate 74  Pulse Rate Source Monitor  Resp 18  BP (!) 136/99  BP Location Right Arm  BP Method Automatic  Patient Position (if appropriate) Sitting  Oxygen Therapy  SpO2 100 %   Discharge Note:  Patient denies SI/HI AVH at this time. Discharge instructions, AVS, prescriptions and transition record gone over with patient. Patient agrees to comply with medication management, follow-up visit, and outpatient therapy. Patient belongings returned to patient. Patient questions and concerns addressed and answered.  Patient ambulatory off unit.  Patient discharged to home with friend.

## 2020-11-06 NOTE — Progress Notes (Signed)
Recreation Therapy Notes  Date: 3.22.22 Time: 0945 Location: 500 Hall Dayroom  Group Topic: Wellness  Goal Area(s) Addresses:  Patient will define components of whole wellness. Patient will verbalize benefit of whole wellness.  Behavioral Response:  Active  Intervention:  Music  Activity: Exercise.  LRT lead group in a number of stretches to loosen up the muscles.  After that, each patient  took turns leading the group in an exercise of their choice.  This gropup aims for at least 30 good minutes of exercise.  Patients were able to take a break or get water if needed.    Education: Wellness, Building control surveyor.   Education Outcome: Acknowledges education/In group clarification offered/Needs additional education.   Clinical Observations/Feedback:  Pt was bright, social with peers and engaged during activity.  Pt completed the stretches and lead the group in multiple exercises.  Pt left early for discharge.   Caroll Rancher, LRT/CTRS    Caroll Rancher A 11/06/2020 10:52 AM

## 2020-11-06 NOTE — Progress Notes (Signed)
  [facility] Adult Case Management Discharge Plan :  Will you be returning to the same living situation after discharge:  No. Will be staying with mother At discharge, do you have transportation home?: Yes,  mother to pick this patient up Do you have the ability to pay for your medications: No. Samples to be provided at discharge  Release of information consent forms completed and in the chart;  Patient's signature needed at discharge.  Patient to Follow up at:  Follow-up Information    [facility] Follow up.   Specialty: Behavioral Health Why: referral made Contact information: [address] [place] [address] [phone]              Next level of care provider has access to [facility] Link:no  Safety Planning and Suicide Prevention discussed: Yes,  with spouse  Have you used any form of tobacco in the last 30 days? (Cigarettes, Smokeless Tobacco, Cigars, and/or Pipes): Yes  Has patient been referred to the Quitline?: Patient refused referral  Patient has been referred for addiction treatment: Pt. refused referral  Otelia Santee, LCSW 11/06/2020, 9:48 AM

## 2020-11-06 NOTE — [facility]HH Suicide Risk Assessment (Signed)
[facility] Discharge Suicide Risk Assessment   Principal Problem: Depression, unspecified Discharge Diagnoses: Principal Problem:   Depression, unspecified Active Problems:   Alcohol dependence with unspecified alcohol-induced disorder (HCC)   Marijuana abuse   Total Time spent with patient: 15 minutes  Musculoskeletal: Strength & Muscle Tone: within normal limits Gait & Station: normal Patient leans: N/A  Psychiatric Specialty Exam: Review of Systems  All other systems reviewed and are negative.   Blood pressure (!) 138/95, pulse 84, temperature 97.9 F (36.6 C), temperature source Oral, resp. rate 18, height 5\' 11"  (1.803 m), weight 78 kg, SpO2 100 %.Body mass index is 23.99 kg/m.  General Appearance: Casual  Eye Contact::  Good  Speech:  Normal Rate409  Volume:  Normal  Mood:  Euthymic  Affect:  Congruent  Thought Process:  Coherent and Descriptions of Associations: Intact  Orientation:  Full (Time, Place, and Person)  Thought Content:  Logical  Suicidal Thoughts:  No  Homicidal Thoughts:  No  Memory:  Immediate;   Good Recent;   Good Remote;   Good  Judgement:  Intact  Insight:  Fair  Psychomotor Activity:  Normal  Concentration:  Good  Recall:  Good  Fund of Knowledge:Good  Language: Good  Akathisia:  Negative  Handed:  Right  AIMS (if indicated):     Assets:  Communication Skills Desire for Improvement Housing Resilience Social Support  Sleep:  Number of Hours: 6.75  Cognition: WNL  ADL's:  Intact   Mental Status Per Nursing Assessment::   On Admission:  NA  Demographic Factors:  Male, Low socioeconomic status and Unemployed  Loss Factors: Financial problems/change in socioeconomic status  Historical Factors: Impulsivity  Risk Reduction Factors:   Living with another person, especially a relative and Positive social support  Continued Clinical Symptoms:  Depression:   Comorbid alcohol abuse/dependence Impulsivity Alcohol/Substance  Abuse/Dependencies  Cognitive Features That Contribute To Risk:  None    Suicide Risk:  Minimal: No identifiable suicidal ideation.  Patients presenting with no risk factors but with morbid ruminations; may be classified as minimal risk based on the severity of the depressive symptoms   Follow-up Information    [facility] Follow up.   Specialty: Behavioral Health Why: referral made Contact information: [address] [place] [phone]              Plan Of Care/Follow-up recommendations:  Activity:  ad lib  [phone], MD 11/06/2020, 9:49 AM
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
## 2020-12-05 ENCOUNTER — Encounter (HOSPITAL_COMMUNITY): Payer: Federal, State, Local not specified - Other | Admitting: Psychiatry

## 2021-12-21 ENCOUNTER — Encounter (HOSPITAL_COMMUNITY): Payer: Self-pay

## 2021-12-21 ENCOUNTER — Other Ambulatory Visit: Payer: Self-pay

## 2021-12-21 ENCOUNTER — Emergency Department (HOSPITAL_COMMUNITY): Payer: Self-pay

## 2021-12-21 ENCOUNTER — Emergency Department (HOSPITAL_COMMUNITY)
Admission: EM | Admit: 2021-12-21 | Discharge: 2021-12-21 | Discharge status: Against Medical Advice | Payer: Self-pay | Attending: Emergency Medicine | Admitting: Emergency Medicine

## 2021-12-21 DIAGNOSIS — M7032 Other bursitis of elbow, left elbow: Secondary | ICD-10-CM | POA: Insufficient documentation

## 2021-12-21 DIAGNOSIS — Y9389 Activity, other specified: Secondary | ICD-10-CM | POA: Insufficient documentation

## 2021-12-21 DIAGNOSIS — Z5321 Procedure and treatment not carried out due to patient leaving prior to being seen by health care provider: Secondary | ICD-10-CM | POA: Insufficient documentation

## 2021-12-21 NOTE — ED Triage Notes (Signed)
Patient said he has bursitis in his left elbow. He said all he wants is it drained.  ?

## 2021-12-25 ENCOUNTER — Emergency Department (HOSPITAL_COMMUNITY)
Admission: EM | Admit: 2021-12-25 | Discharge: 2021-12-25 | Disposition: A | Discharge status: Home / Self Care | Payer: Self-pay | Attending: Emergency Medicine | Admitting: Emergency Medicine

## 2021-12-25 ENCOUNTER — Encounter (HOSPITAL_COMMUNITY): Payer: Self-pay

## 2021-12-25 DIAGNOSIS — M7022 Olecranon bursitis, left elbow: Secondary | ICD-10-CM | POA: Insufficient documentation

## 2021-12-25 DIAGNOSIS — M25522 Pain in left elbow: Secondary | ICD-10-CM | POA: Insufficient documentation

## 2021-12-25 DIAGNOSIS — Y9389 Activity, other specified: Secondary | ICD-10-CM | POA: Insufficient documentation

## 2021-12-25 MED ORDER — DICLOFENAC SODIUM 75 MG PO TBEC: 75.0000 mg | DELAYED_RELEASE_TABLET | Freq: Two times a day (BID) | ORAL | 0 refills | Status: DC

## 2021-12-25 MED ORDER — METHYLPREDNISOLONE SODIUM SUCC 125 MG IJ SOLR: 125.0000 mg | Freq: Once | INTRAMUSCULAR | Status: DC

## 2021-12-25 MED ORDER — METHYLPREDNISOLONE SODIUM SUCC 125 MG IJ SOLR
125.0000 mg | Freq: Once | INTRAMUSCULAR | Status: AC
  Administered 2021-12-25: 125 mg via INTRAMUSCULAR
  Filled 2021-12-25: qty 2

## 2021-12-25 NOTE — ED Triage Notes (Signed)
Pt presents with c/o swelling in his left elbow for approx one month. Elbow is swollen and tender to touch. ?

## 2021-12-25 NOTE — ED Provider Notes (Signed)
?[facility]-EMERGENCY DEPT ?Provider Note ? ? ?CSN: [id number] ?Arrival date & time: 12/25/21  0708 ? ?  ? ?History ? ?Chief Complaint  ?Patient presents with  ? Elbow Pain  ? ? ?Alexander Crawford is a 41 y.o. male. ? ?Pt complains of swelling to left elbow .  Pt reports he had the same in right elbow previously   ? ?The history is provided by the patient. No language interpreter was used.  ? ?  ? ?Home Medications ?Prior to Admission medications   ?Medication Sig Start Date End Date Taking? Authorizing Provider  ?albuterol (VENTOLIN HFA) 108 (90 Base) MCG/ACT inhaler Inhale 2 puffs into the lungs every 6 (six) hours as needed for wheezing or shortness of breath.    [provider]  ?escitalopram (LEXAPRO) 10 MG tablet Take 1 tablet (10 mg total) by mouth daily. 11/07/20   Laveda Abbe, NP  ?hydrOXYzine (ATARAX/VISTARIL) 25 MG tablet Take 1 tablet (25 mg total) by mouth every 8 (eight) hours as needed for anxiety. 11/06/20   Laveda Abbe, NP  ?mirtazapine (REMERON) 7.5 MG tablet Take 1 tablet (7.5 mg total) by mouth at bedtime. 11/06/20   Laveda Abbe, NP  ?QUEtiapine (SEROQUEL) 200 MG tablet Take 1 tablet (200 mg total) by mouth at bedtime. 11/06/20   Laveda Abbe, NP  ?traZODone (DESYREL) 50 MG tablet Take 1 tablet (50 mg total) by mouth at bedtime as needed for sleep. 11/06/20   Laveda Abbe, NP  ?   ? ?Allergies    ?Patient has no known allergies.   ? ?Review of Systems   ?Review of Systems  ?Musculoskeletal:  Positive for joint swelling and myalgias.  ?All other systems reviewed and are negative. ? ?Physical Exam ?Updated Vital Signs ?BP (!) 138/100 (BP Location: Left Arm)   Pulse 86   Temp 99 ?F (37.2 ?C) (Oral)   Resp 18   SpO2 96%  ?Physical Exam ?Vitals reviewed.  ?Musculoskeletal:     ?   General: Swelling and tenderness present.  ?   Comments: Swollen left elbow bursa.  From  with pain.  NV and ns intact   ?Skin: ?   General: Skin is warm.   ?Neurological:  ?   General: No focal deficit present.  ?   Mental Status: He is alert.  ?Psychiatric:     ?   Mood and Affect: Mood normal.  ? ? ?ED Results / Procedures / Treatments   ?Labs ?(all labs ordered are listed, but only abnormal results are displayed) ?Labs Reviewed - No data to display ? ?EKG ?None ? ?Radiology ?No results found. ? ?Procedures ?Procedures  ? ? ?Medications Ordered in ED ?Medications  ?methylPREDNISolone sodium succinate (SOLU-MEDROL) 125 mg/2 mL injection 125 mg (has no administration in time range)  ? ? ?ED Course/ Medical Decision Making/ A&P ?  ?                        ?Medical Decision Making ?Swelling left elbow bursa  ? ?Risk ?Prescription drug management. ?Risk Details: Pt has swollen bursa.  Pt reports difficulty getting medications filled.  I will given him and injection of solumedrol  here.  Rx for voltaren.  Pt placed in a sling ? ? ? ? ? ? ? ? ? ? ?Final Clinical Impression(s) / ED Diagnoses ?Final diagnoses:  ?Olecranon bursitis of left elbow  ? ? ?Rx / DC Orders ?ED Discharge Orders   ? ?  Ordered  ?  diclofenac (VOLTAREN) 75 MG EC tablet  2 times daily       ? 12/25/21 0838  ? ?  ?  ? ?  ?An After Visit Summary was printed and given to the patient.  ? ?  ?Elson Areas, [place] ?12/25/21 1610 ? ?  ?Sloan Leiter, DO ?12/25/21 1933 ? ?

## 2022-02-12 ENCOUNTER — Ambulatory Visit (HOSPITAL_COMMUNITY)
Admission: EM | Admit: 2022-02-12 | Discharge: 2022-02-12 | Disposition: A | Discharge status: Home / Self Care | Payer: No Payment, Other | Attending: Psychiatry | Admitting: Psychiatry

## 2022-02-12 DIAGNOSIS — Z9151 Personal history of suicidal behavior: Secondary | ICD-10-CM | POA: Insufficient documentation

## 2022-02-12 DIAGNOSIS — F1020 Alcohol dependence, uncomplicated: Secondary | ICD-10-CM | POA: Insufficient documentation

## 2022-02-12 DIAGNOSIS — G8929 Other chronic pain: Secondary | ICD-10-CM | POA: Insufficient documentation

## 2022-02-12 DIAGNOSIS — F1090 Alcohol use, unspecified, uncomplicated: Secondary | ICD-10-CM

## 2022-02-12 DIAGNOSIS — F332 Major depressive disorder, recurrent severe without psychotic features: Secondary | ICD-10-CM | POA: Insufficient documentation
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
## 2022-02-12 NOTE — Discharge Instructions (Addendum)

## 2022-09-11 ENCOUNTER — Encounter (HOSPITAL_COMMUNITY): Payer: Self-pay | Admitting: Physician Assistant

## 2022-09-11 ENCOUNTER — Ambulatory Visit (INDEPENDENT_AMBULATORY_CARE_PROVIDER_SITE_OTHER): Payer: No Payment, Other | Admitting: Physician Assistant

## 2022-09-11 VITALS — BP 146/109 | HR 72 | Temp 98.0°F | Resp 16 | Ht 71.0 in | Wt 194.0 lb

## 2022-09-11 DIAGNOSIS — F5105 Insomnia due to other mental disorder: Secondary | ICD-10-CM

## 2022-09-11 DIAGNOSIS — F331 Major depressive disorder, recurrent, moderate: Secondary | ICD-10-CM | POA: Diagnosis not present

## 2022-09-11 DIAGNOSIS — F99 Mental disorder, not otherwise specified: Secondary | ICD-10-CM

## 2022-09-11 DIAGNOSIS — F4310 Post-traumatic stress disorder, unspecified: Secondary | ICD-10-CM | POA: Diagnosis not present

## 2022-09-11 DIAGNOSIS — F411 Generalized anxiety disorder: Secondary | ICD-10-CM | POA: Diagnosis not present

## 2022-09-11 MED ORDER — QUETIAPINE FUMARATE 50 MG PO TABS: 50.0000 mg | ORAL_TABLET | Freq: Every day | ORAL | 1 refills | Status: DC

## 2022-09-11 MED ORDER — TRAZODONE HCL 50 MG PO TABS: 50.0000 mg | ORAL_TABLET | Freq: Every day | ORAL | 1 refills | Status: DC

## 2022-09-11 MED ORDER — HYDROXYZINE HCL 25 MG PO TABS: 25.0000 mg | ORAL_TABLET | Freq: Three times a day (TID) | ORAL | 1 refills | Status: DC | PRN

## 2022-09-11 MED ORDER — DICLOFENAC SODIUM 75 MG PO TBEC: 75.0000 mg | DELAYED_RELEASE_TABLET | Freq: Two times a day (BID) | ORAL | 0 refills | Status: AC

## 2022-09-11 MED ORDER — MIRTAZAPINE 7.5 MG PO TABS: 7.5000 mg | ORAL_TABLET | Freq: Every day | ORAL | 1 refills | Status: DC

## 2022-09-11 MED ORDER — ESCITALOPRAM OXALATE 10 MG PO TABS: 10.0000 mg | ORAL_TABLET | Freq: Every day | ORAL | 1 refills | Status: DC
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
## 2022-10-23 IMAGING — CR DG ELBOW COMPLETE 3+V*R*
4 series · 4 of 4 positions shown · non-contrast
Comparison: None.

CLINICAL DATA: Right olecranon on pain, injury

EXAM:
RIGHT ELBOW - COMPLETE 3+ VIEW

[x elbow ap right]
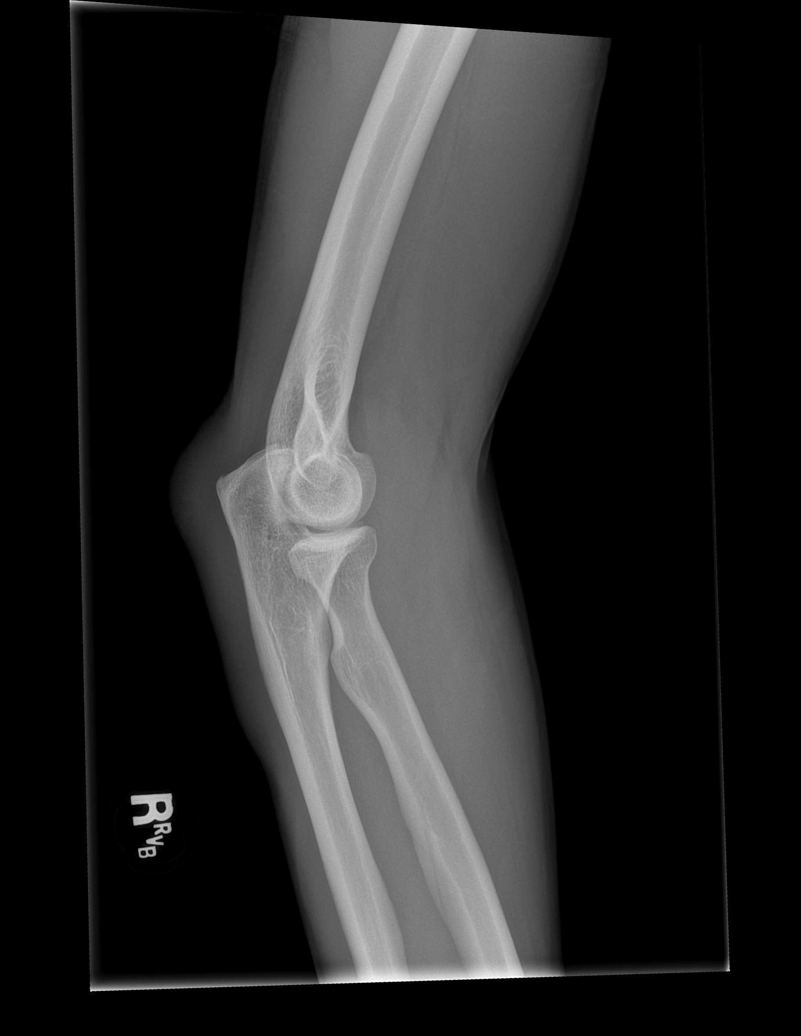

[x elbow obl right (1 of 2)]
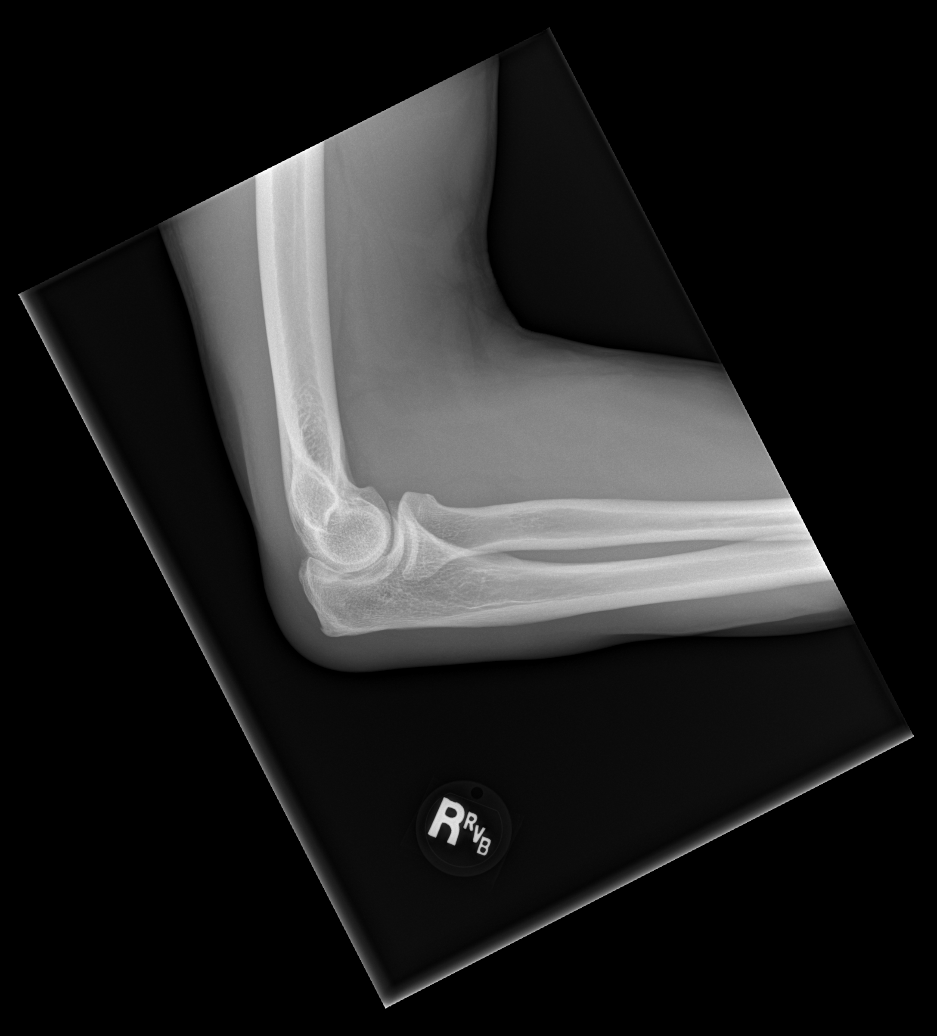

[x elbow obl right (2 of 2)]
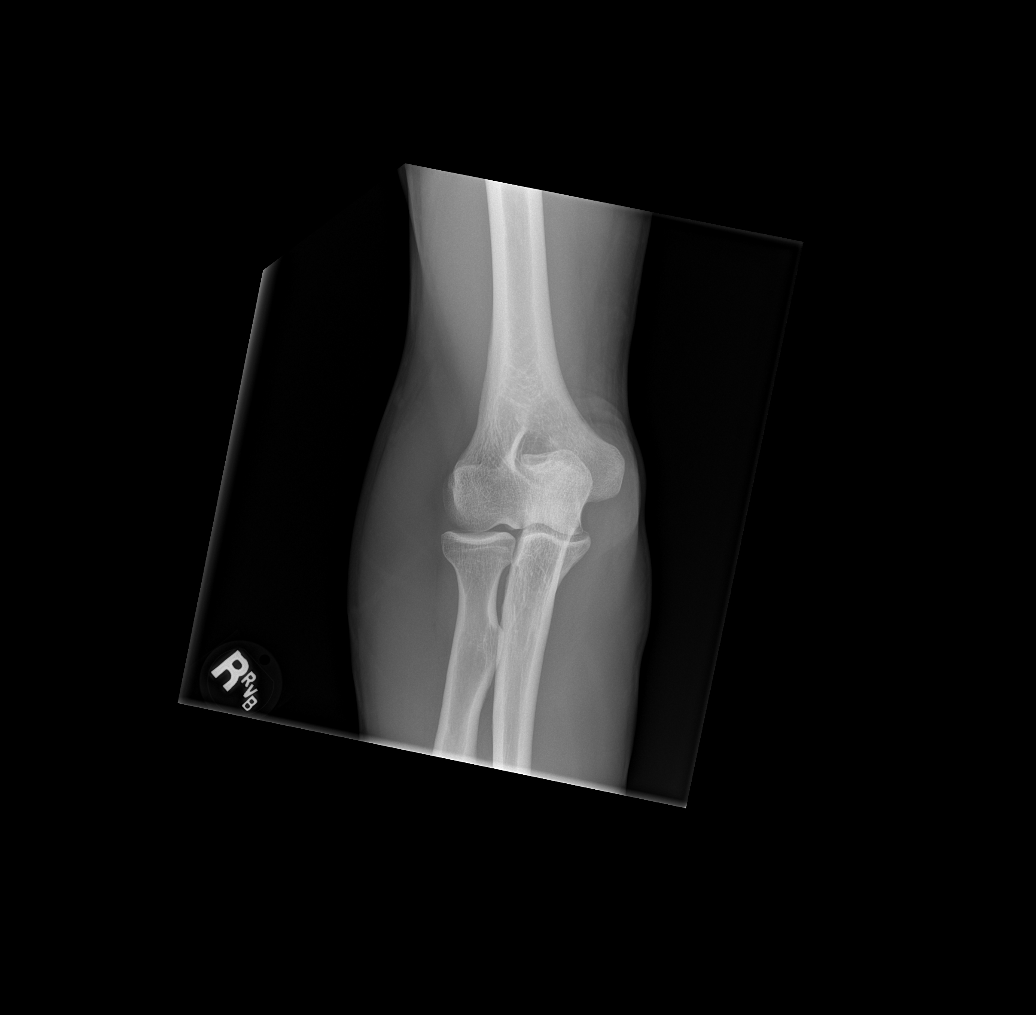

[x elbow lat right]
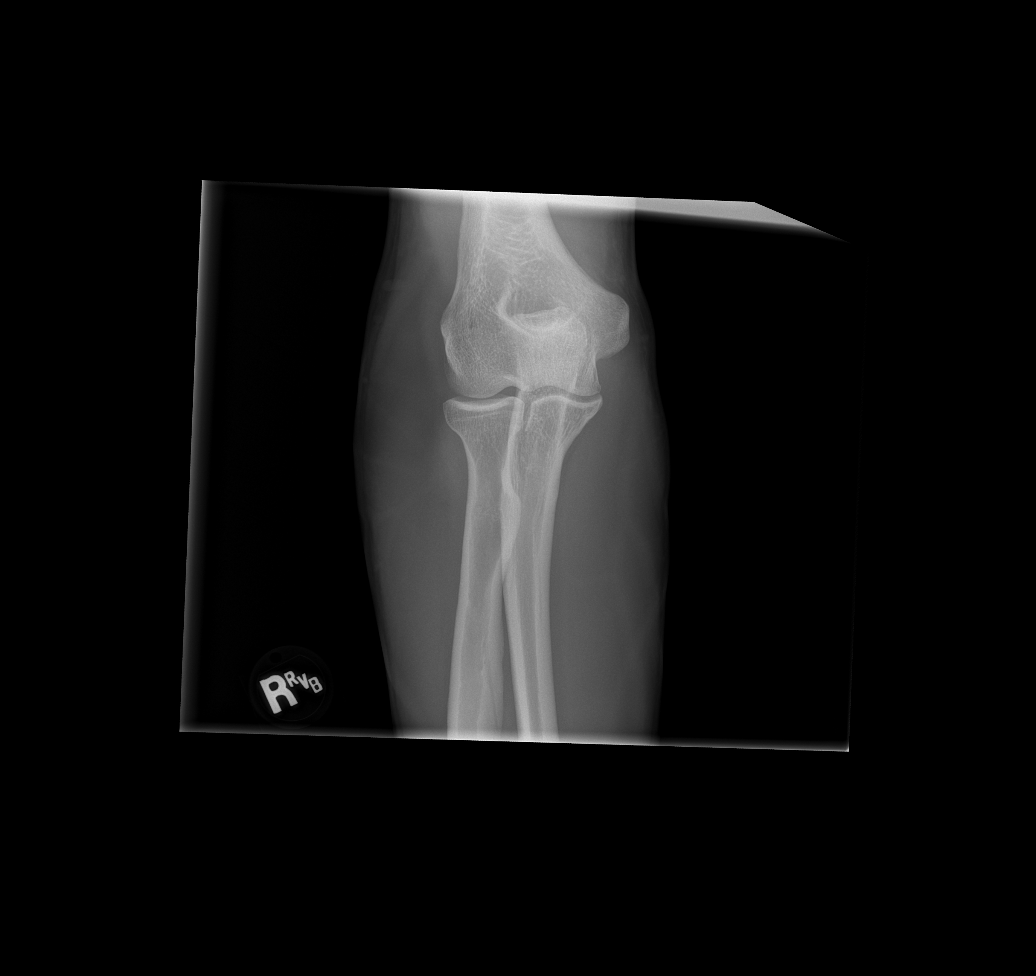

[4 of 4 positions shown; findings below may reference images not displayed]

FINDINGS: Frontal, bilateral oblique, lateral views of the right elbow are
obtained. No fracture, subluxation, or dislocation. No joint
effusion. Joint spaces are well preserved. Dorsal soft tissue
swelling over the olecranon could reflect posttraumatic change or
bursitis.
IMPRESSION: 1. No acute bony abnormality.
2. Dorsal soft tissue prominence could reflect posttraumatic change
or olecranon bursitis.

## 2022-10-24 ENCOUNTER — Ambulatory Visit (INDEPENDENT_AMBULATORY_CARE_PROVIDER_SITE_OTHER): Payer: Medicaid Other | Admitting: Physician Assistant

## 2022-10-24 ENCOUNTER — Encounter (HOSPITAL_COMMUNITY): Payer: Self-pay | Admitting: Physician Assistant

## 2022-10-24 DIAGNOSIS — F4310 Post-traumatic stress disorder, unspecified: Secondary | ICD-10-CM

## 2022-10-24 DIAGNOSIS — F99 Mental disorder, not otherwise specified: Secondary | ICD-10-CM

## 2022-10-24 DIAGNOSIS — F5105 Insomnia due to other mental disorder: Secondary | ICD-10-CM | POA: Diagnosis not present

## 2022-10-24 DIAGNOSIS — F411 Generalized anxiety disorder: Secondary | ICD-10-CM

## 2022-10-24 DIAGNOSIS — F331 Major depressive disorder, recurrent, moderate: Secondary | ICD-10-CM

## 2022-10-24 MED ORDER — MIRTAZAPINE 7.5 MG PO TABS: 7.5000 mg | ORAL_TABLET | Freq: Every day | ORAL | 2 refills | Status: AC

## 2022-10-24 MED ORDER — ESCITALOPRAM OXALATE 10 MG PO TABS: 10.0000 mg | ORAL_TABLET | Freq: Every day | ORAL | 2 refills | Status: AC

## 2022-10-24 MED ORDER — QUETIAPINE FUMARATE 50 MG PO TABS: 50.0000 mg | ORAL_TABLET | Freq: Every day | ORAL | 2 refills | Status: AC

## 2022-10-24 MED ORDER — TRAZODONE HCL 50 MG PO TABS: 50.0000 mg | ORAL_TABLET | Freq: Every day | ORAL | 2 refills | Status: AC

## 2022-10-24 MED ORDER — PRAZOSIN HCL 1 MG PO CAPS: 1.0000 mg | ORAL_CAPSULE | Freq: Every day | ORAL | 2 refills | Status: AC

## 2022-10-24 MED ORDER — HYDROXYZINE HCL 25 MG PO TABS: 25.0000 mg | ORAL_TABLET | Freq: Three times a day (TID) | ORAL | 2 refills | Status: AC | PRN
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
## 2022-12-09 ENCOUNTER — Telehealth (HOSPITAL_COMMUNITY): Payer: Self-pay | Admitting: Licensed Clinical Social Worker

## 2022-12-09 NOTE — Telephone Encounter (Signed)
The therapist sends two emails to [name] in response to her request for an update regarding this patient which are as follows: "Good Morning,  You sent a request for an update on Alexander Crawford's treatment. Could you please send me is date of birth?   Thank you,:" and  "He was last seen for a med eval on 10/24/22 and is scheduled for another one on 12/26/22."    Myrna Blazer, MA, LCSW, [facility], LCAS 12/09/2022

## 2022-12-26 ENCOUNTER — Ambulatory Visit (HOSPITAL_COMMUNITY): Payer: Medicaid Other | Admitting: Physician Assistant

## 2023-01-08 ENCOUNTER — Telehealth (HOSPITAL_COMMUNITY): Payer: Self-pay | Admitting: Licensed Clinical Social Worker

## 2023-01-08 NOTE — Telephone Encounter (Signed)
The therapist receives the following email from [name] from [facility]:  "Good afternoon,   Can I please get an update on Alexander Crawford DOB: Jan 17, 1981. Last update I received He was scheduled to have had an appointment on 12/26/2022.   Thank you,   Delsa Bern [facility] Care Manager [facility] [phone] [address] [place], [place] [address] Work Cell: [phone] Fax: [phone] Email: [email or web address] "  The therapist responds with a separate email to her with the subject line, "Update" and the following:  " Good afternoon,   Alexander Crawford no showed for his med eval on 12/26/22 and presently has no other appointments scheduled with [place].  Regards,"  Myrna Blazer, MA, LCSW, [facility], LCAS 01/08/2023

## 2024-01-03 IMAGING — CR DG ELBOW COMPLETE 3+V*L*
4 series · 4 of 4 positions shown · non-contrast
Comparison: None Available.

CLINICAL DATA: Left elbow pain.

EXAM:
LEFT ELBOW - COMPLETE 3+ VIEW

[x elbow ap left]
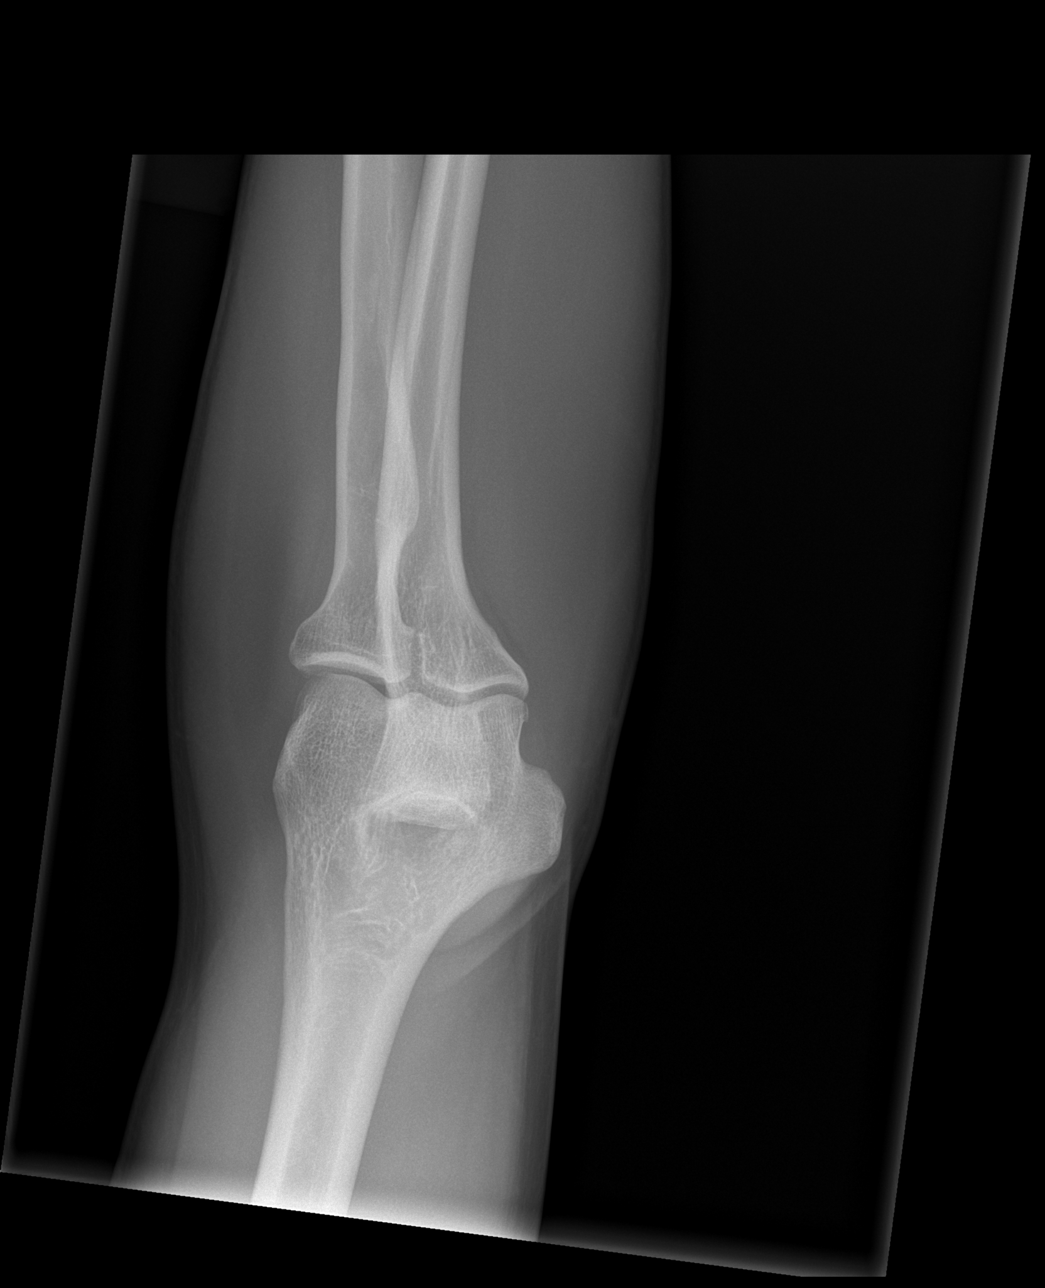

[x elbow obl left (1 of 2)]
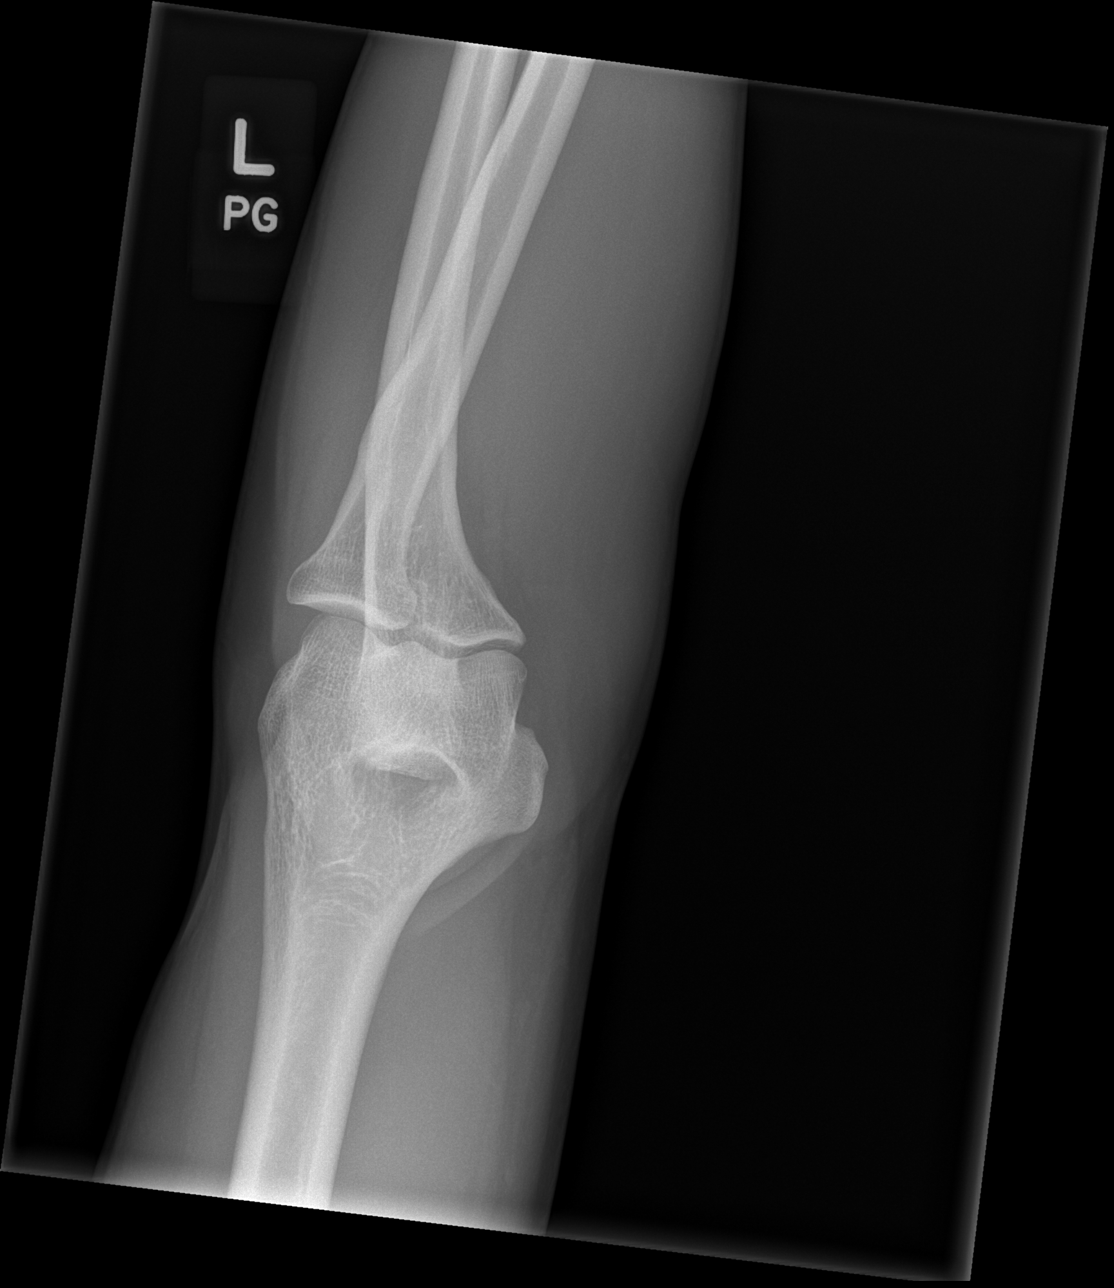

[x elbow obl left (2 of 2)]
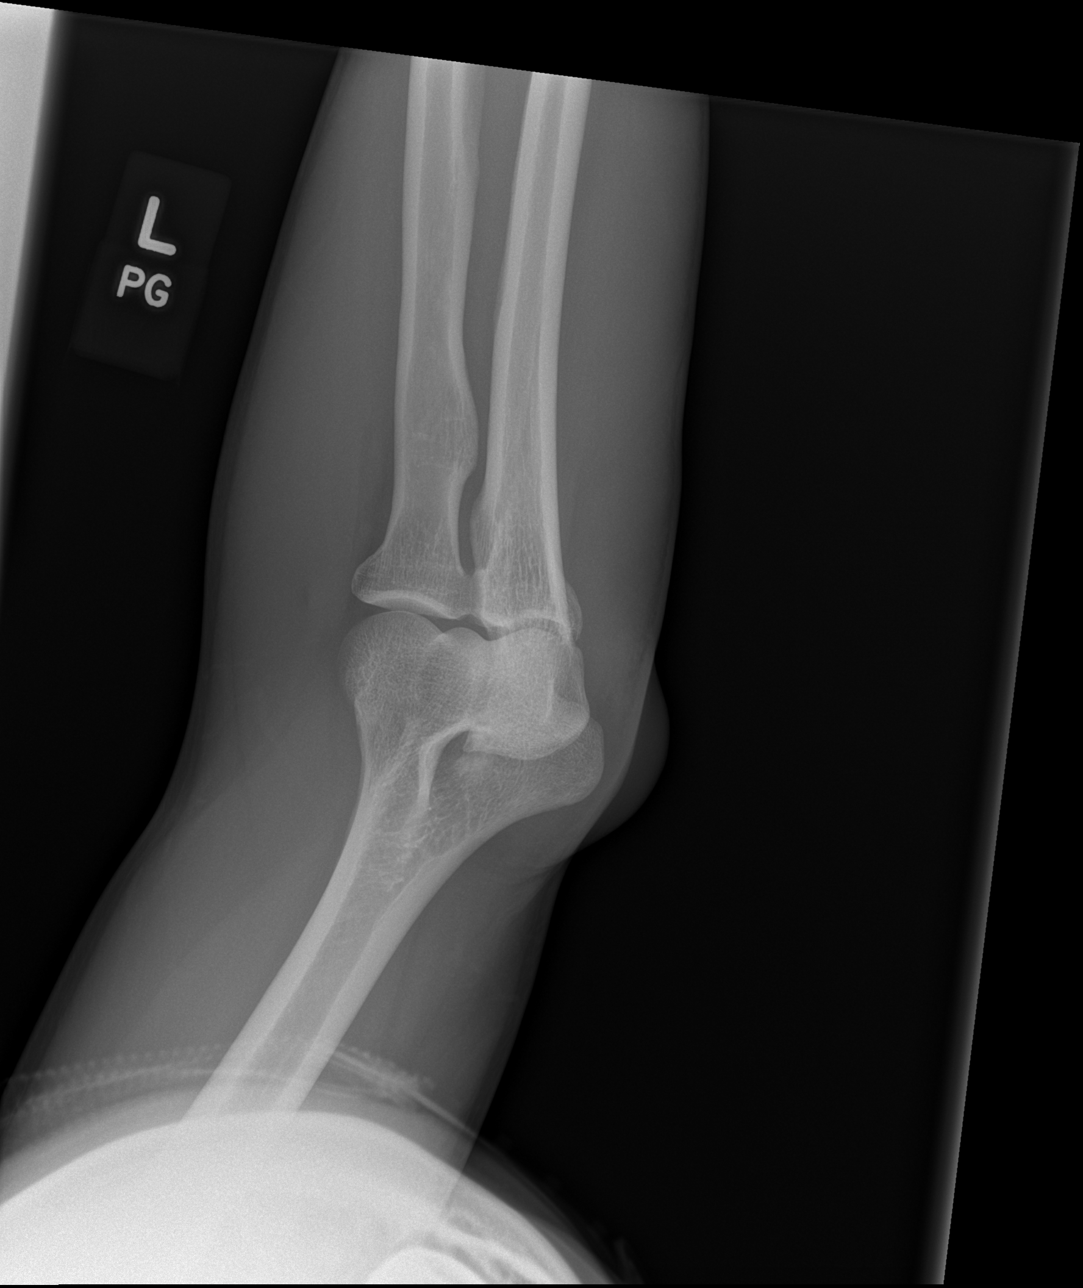

[x elbow lat left]
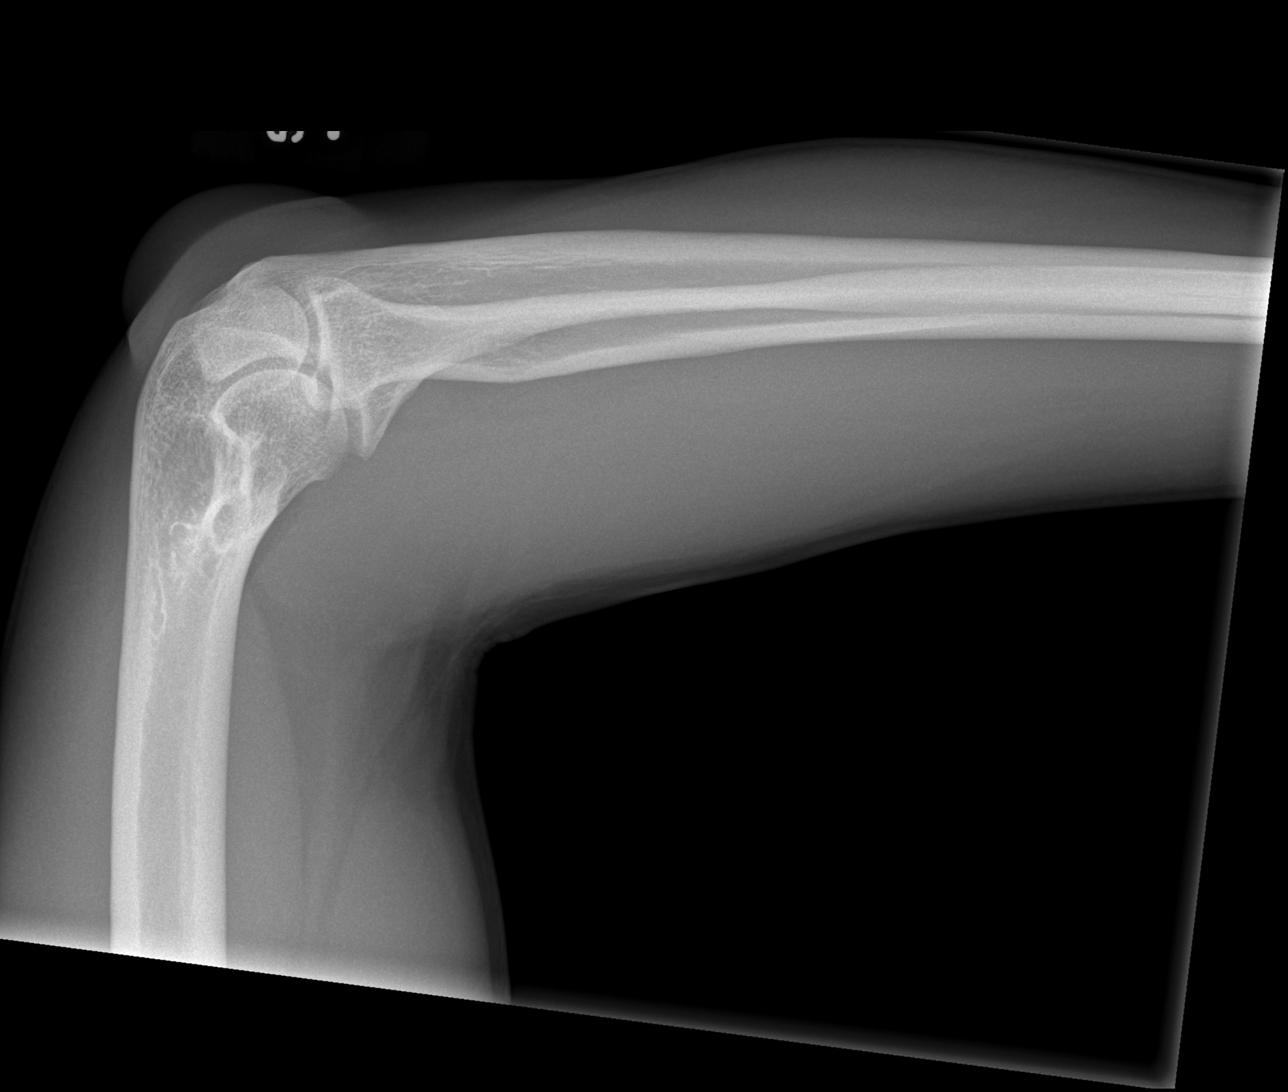

[4 of 4 positions shown; findings below may reference images not displayed]

FINDINGS: There is no evidence of fracture, dislocation, or joint effusion.
There is no evidence of arthropathy or other focal bone abnormality.
There is prominent focal soft tissue protuberance overlying the
olecranon.
IMPRESSION: 1. Evidence of fractures or joint effusion.
2. Prominent focal soft tissue protuberance overlying the olecranon,
could be posttraumatic, inflammatory or due to olecranon bursitis.
